# Patient Record
Sex: Female | Born: 1950 | Race: White | Hispanic: No | Marital: Married | Smoking: Never smoker
Health system: Southern US, Community
[De-identification: ages and names within clinical notes are randomized; demographics above are authoritative.]

## PROBLEM LIST (undated history)

## (undated) DIAGNOSIS — B059 Measles without complication: Secondary | ICD-10-CM

## (undated) DIAGNOSIS — B019 Varicella without complication: Secondary | ICD-10-CM

## (undated) DIAGNOSIS — T7840XA Allergy, unspecified, initial encounter: Secondary | ICD-10-CM

## (undated) HISTORY — DX: Varicella without complication: B01.9

## (undated) HISTORY — DX: Measles without complication: B05.9

## (undated) HISTORY — DX: Allergy, unspecified, initial encounter: T78.40XA

---

## 1987-07-31 HISTORY — PX: ABDOMINAL HYSTERECTOMY: SHX81

## 2012-06-29 LAB — HM MAMMOGRAPHY: HM Mammogram: NORMAL

## 2013-09-17 ENCOUNTER — Telehealth: Payer: Self-pay

## 2013-09-17 NOTE — Telephone Encounter (Signed)
Pt has new pt to establish appt on 09/21/13 and has not received NPP; pt will come early to fill out new pt packet prior to visit.

## 2013-09-21 ENCOUNTER — Ambulatory Visit (INDEPENDENT_AMBULATORY_CARE_PROVIDER_SITE_OTHER): Payer: BC Managed Care – PPO | Admitting: Internal Medicine

## 2013-09-21 ENCOUNTER — Encounter: Payer: Self-pay | Admitting: Internal Medicine

## 2013-09-21 VITALS — BP 146/98 | HR 99 | Temp 98.4°F | Ht 62.0 in | Wt 174.0 lb

## 2013-09-21 DIAGNOSIS — R059 Cough, unspecified: Secondary | ICD-10-CM

## 2013-09-21 DIAGNOSIS — J302 Other seasonal allergic rhinitis: Secondary | ICD-10-CM | POA: Insufficient documentation

## 2013-09-21 DIAGNOSIS — R05 Cough: Secondary | ICD-10-CM

## 2013-09-21 DIAGNOSIS — N951 Menopausal and female climacteric states: Secondary | ICD-10-CM

## 2013-09-21 DIAGNOSIS — R232 Flushing: Secondary | ICD-10-CM

## 2013-09-21 DIAGNOSIS — Z0000 Encounter for general adult medical examination without abnormal findings: Secondary | ICD-10-CM

## 2013-09-21 DIAGNOSIS — J309 Allergic rhinitis, unspecified: Secondary | ICD-10-CM

## 2013-09-21 DIAGNOSIS — E669 Obesity, unspecified: Secondary | ICD-10-CM | POA: Insufficient documentation

## 2013-09-21 LAB — CBC
HCT: 46.1 % — ABNORMAL HIGH (ref 36.0–46.0)
Hemoglobin: 15 g/dL (ref 12.0–15.0)
MCHC: 32.6 g/dL (ref 30.0–36.0)
MCV: 91.8 fl (ref 78.0–100.0)
Platelets: 365 10*3/uL (ref 150.0–400.0)
RBC: 5.02 Mil/uL (ref 3.87–5.11)
RDW: 13.7 % (ref 11.5–14.6)
WBC: 9 10*3/uL (ref 4.5–10.5)

## 2013-09-21 LAB — COMPREHENSIVE METABOLIC PANEL
ALT: 39 U/L — ABNORMAL HIGH (ref 0–35)
AST: 24 U/L (ref 0–37)
Albumin: 4.6 g/dL (ref 3.5–5.2)
Alkaline Phosphatase: 87 U/L (ref 39–117)
BUN: 13 mg/dL (ref 6–23)
CO2: 25 mEq/L (ref 19–32)
Calcium: 10.3 mg/dL (ref 8.4–10.5)
Chloride: 101 mEq/L (ref 96–112)
Creatinine, Ser: 0.7 mg/dL (ref 0.4–1.2)
GFR: 96.02 mL/min (ref >60.00)
Glucose, Bld: 104 mg/dL — ABNORMAL HIGH (ref 70–99)
Potassium: 4.1 mEq/L (ref 3.5–5.1)
Sodium: 138 mEq/L (ref 135–145)
Total Bilirubin: 0.6 mg/dL (ref 0.3–1.2)
Total Protein: 8.6 g/dL — ABNORMAL HIGH (ref 6.0–8.3)

## 2013-09-21 LAB — LDL CHOLESTEROL, DIRECT: Direct LDL: 168 mg/dL

## 2013-09-21 LAB — LIPID PANEL
Cholesterol: 247 mg/dL — ABNORMAL HIGH (ref 0–200)
HDL: 58 mg/dL (ref >39.00)
Total CHOL/HDL Ratio: 4
Triglycerides: 217 mg/dL — ABNORMAL HIGH (ref 0.0–149.0)
VLDL: 43.4 mg/dL — ABNORMAL HIGH (ref 0.0–40.0)

## 2013-09-21 LAB — TSH: TSH: 4.33 u[IU]/mL (ref 0.35–5.50)

## 2013-09-21 MED ORDER — ESTROGENS CONJUGATED 0.3 MG PO TABS: 0.3000 mg | ORAL_TABLET | Freq: Every day | ORAL | Status: DC

## 2013-09-21 MED ORDER — LEVOCETIRIZINE DIHYDROCHLORIDE 5 MG PO TABS: 5.0000 mg | ORAL_TABLET | Freq: Every evening | ORAL | Status: DC

## 2013-09-21 MED ORDER — BENZONATATE 200 MG PO CAPS: 200.0000 mg | ORAL_CAPSULE | Freq: Three times a day (TID) | ORAL | Status: DC | PRN

## 2013-09-21 NOTE — Progress Notes (Signed)
Pre visit review using our clinic review tool, if applicable. No additional management support is needed unless otherwise documented below in the visit note. 

## 2013-09-21 NOTE — Patient Instructions (Addendum)

## 2013-09-21 NOTE — Progress Notes (Signed)
HPI Pt presents to the clinic today to establish care. She is transferring care from her PCP in [place]. She recently moved down her in September. She does have some concerns about a cough she has had since moving down here. The cough is fairly unproductive. She denies fever, chills or body aches. She has tried Careers adviser, robitussin and mucinex without much relief. She does not smoke. She was seen at [facility] for the same. She was given a zpack which also provided no relief.  Flu: never Tetanus: not up to date Pneumovax: never Zostovax: never Pap Smear: 1989 Mammogram: 06/2012 Colon Screening: never, not ready to have one set up yet Bone Density: 2009 Eye doctor: as needed Dentist: as needed  Past Medical History  Diagnosis Date  . Chicken pox   . Measles   . Allergy     Current Outpatient Prescriptions  Medication Sig Dispense Refill  . B Complex-C (SUPER B COMPLEX/VITAMIN C PO) Take 1 capsule by mouth daily.      Marland Kitchen Bioflavonoid Products (ESTER-C) TABS Take 1 tablet by mouth daily.      . Calcium Carb-Cholecalciferol (CALCIUM + D3 PO) Take 1 capsule by mouth daily.      Marland Kitchen estrogens, conjugated, (PREMARIN) 0.3 MG tablet Take 0.3 mg by mouth daily. Take daily for 21 days then do not take for 7 days.      . fexofenadine (ALLEGRA) 180 MG tablet Take 180 mg by mouth daily.      . Fish Oil-Cholecalciferol (FISH OIL + D3) 1000-1000 MG-UNIT CAPS Take 2 capsules by mouth daily.      . Misc Natural Products (OSTEO BI-FLEX JOINT SHIELD) TABS Take 1 tablet by mouth daily.       No current facility-administered medications for this visit.    Allergies  Allergen Reactions  . Codeine Hives  . Dilaudid [Hydromorphone Hcl] Hives  . Keflex [Cephalexin] Hives  . Morphine And Related Hives  . Penicillins Hives    Family History  Problem Relation Age of Onset  . Arthritis Mother   . Breast cancer Sister   . Uterine cancer Sister   . Diabetes Daughter     History   Social History  .  Marital Status: Married    Spouse Name: N/A    Number of Children: N/A  . Years of Education: N/A   Occupational History  . Not on file.   Social History Main Topics  . Smoking status: Never Smoker   . Smokeless tobacco: Never Used  . Alcohol Use: 0.6 oz/week    1 Cans of beer per week     Comment: occasional  . Drug Use: No  . Sexual Activity: Not Currently   Other Topics Concern  . Not on file   Social History Narrative  . No narrative on file    ROS:  Constitutional: Denies fever, malaise, fatigue, headache or abrupt weight changes.  HEENT: Denies eye pain, eye redness, ear pain, ringing in the ears, wax buildup, runny nose, nasal congestion, bloody nose, or sore throat. Respiratory: Pt reports cough. Denies difficulty breathing, shortness of breath, cough or sputum production.   Cardiovascular: Denies chest pain, chest tightness, palpitations or swelling in the hands or feet.  Gastrointestinal: Denies abdominal pain, bloating, constipation, diarrhea or blood in the stool.  GU: Pt reports stress incontinence. Denies frequency, urgency, pain with urination, blood in urine, odor or discharge. Musculoskeletal: Denies decrease in range of motion, difficulty with gait, muscle pain or joint pain and  swelling.  Skin: Denies redness, rashes, lesions or ulcercations.  Neurological: Denies dizziness, difficulty with memory, difficulty with speech or problems with balance and coordination.   No other specific complaints in a complete review of systems (except as listed in HPI above).  PE:  BP 146/98  Pulse 99  Temp(Src) 98.4 F (36.9 C) (Oral)  Ht 5\' 2"  (1.575 m)  Wt 174 lb (78.926 kg)  BMI 31.82 kg/m2  SpO2 98% Wt Readings from Last 3 Encounters:  09/21/13 174 lb (78.926 kg)    General: Appears her stated age, obese but well developed, well nourished in NAD. HEENT: Head: normal shape and size; Eyes: sclera white, no icterus, conjunctiva pink, PERRLA and EOMs intact;  Ears: Tm's gray and intact, normal light reflex; Nose: mucosa pink and moist, septum midline; Throat/Mouth: Teeth present, mucosa pink and moist, no lesions or ulcerations noted.  Neck: Normal range of motion. Neck supple, trachea midline. No massses, lumps or thyromegaly present.  Cardiovascular: Normal rate and rhythm. S1,S2 noted.  No murmur, rubs or gallops noted. No JVD or BLE edema. No carotid bruits noted. Pulmonary/Chest: Normal effort and positive vesicular breath sounds. No respiratory distress. No wheezes, rales or ronchi noted.  Abdomen: Soft and nontender. Normal bowel sounds, no bruits noted. No distention or masses noted. Liver, spleen and kidneys non palpable. Musculoskeletal: Normal range of motion. No signs of joint swelling. No difficulty with gait.  Neurological: Alert and oriented. Cranial nerves II-XII intact. Coordination normal. +DTRs bilaterally. Psychiatric: Mood and affect normal. Behavior is normal. Judgment and thought content normal.      Assessment and Plan:  Preventative Health Maintenance:  Encouraged her to work on diet and exercise She declines all preventative vaccines Encouraged her to establish with an eye doctor and dentist  Will obtain screening lab work today She declines colon screening at this time She declines repeat bone scan   Cough:  ? Allergy related She declines RX for flonase Will switch allegra to xyzal 5 mg daily eRx for Tessalon pearls  RTC in 1 year or sooner if cough does not resolve

## 2013-10-18 ENCOUNTER — Other Ambulatory Visit: Payer: Self-pay | Admitting: Internal Medicine

## 2013-10-19 NOTE — Telephone Encounter (Signed)
Received refill request from pharmacy electronically. Prescriptions were given at office visit on  09/21/13 with no refills. Is it okay to refill medication?

## 2014-01-12 ENCOUNTER — Other Ambulatory Visit: Payer: Self-pay | Admitting: *Deleted

## 2014-01-12 MED ORDER — ESTROGENS CONJUGATED 0.3 MG PO TABS: ORAL_TABLET | ORAL | Status: DC

## 2014-01-12 MED ORDER — LEVOCETIRIZINE DIHYDROCHLORIDE 5 MG PO TABS: ORAL_TABLET | ORAL | Status: DC

## 2014-03-01 ENCOUNTER — Ambulatory Visit (INDEPENDENT_AMBULATORY_CARE_PROVIDER_SITE_OTHER): Payer: BC Managed Care – PPO | Admitting: Internal Medicine

## 2014-03-01 ENCOUNTER — Encounter: Payer: Self-pay | Admitting: Internal Medicine

## 2014-03-01 VITALS — BP 148/80 | HR 97 | Temp 98.6°F | Wt 181.0 lb

## 2014-03-01 DIAGNOSIS — B309 Viral conjunctivitis, unspecified: Secondary | ICD-10-CM

## 2014-03-01 NOTE — Progress Notes (Signed)
Pre visit review using our clinic review tool, if applicable. No additional management support is needed unless otherwise documented below in the visit note. 

## 2014-03-01 NOTE — Patient Instructions (Addendum)

## 2014-03-01 NOTE — Progress Notes (Signed)
Subjective:    Patient ID: Kristina Bradley, female    DOB: 07-22-1951, 63 y.o.   MRN: [id number]  HPI  Pt presents to the clinic today with c/o  redness in the eye. She reports it started yesterday. There is no pain or drainage from the eye. She has not tried anything OTC. She has not come in contact with anyone that she know has pink eye. She has not tried on any new makeup products.  Review of Systems      Past Medical History  Diagnosis Date  . Chicken pox   . Measles   . Allergy     Current Outpatient Prescriptions  Medication Sig Dispense Refill  . B Complex-C (SUPER B COMPLEX/VITAMIN C PO) Take 1 capsule by mouth daily.      . benzonatate (TESSALON) 200 MG capsule Take 1 capsule (200 mg total) by mouth 3 (three) times daily as needed for cough.  30 capsule  0  . Bioflavonoid Products (ESTER-C) TABS Take 1 tablet by mouth daily.      . Calcium Carb-Cholecalciferol (CALCIUM + D3 PO) Take 1 capsule by mouth daily.      Marland Kitchen estrogens, conjugated, (PREMARIN) 0.3 MG tablet TAKE 1 TABLET (0.3 MG TOTAL) BY MOUTH DAILY. TAKE DAILY FOR 21 DAYS THEN DO NOT TAKE FOR 7 DAYS.  30 tablet  2  . fexofenadine (ALLEGRA) 180 MG tablet Take 180 mg by mouth daily.      . Fish Oil-Cholecalciferol (FISH OIL + D3) 1000-1000 MG-UNIT CAPS Take 2 capsules by mouth daily.      Marland Kitchen levocetirizine (XYZAL) 5 MG tablet TAKE 1 TABLET (5 MG TOTAL) BY MOUTH EVERY EVENING.  30 tablet  5  . Misc Natural Products (OSTEO BI-FLEX JOINT SHIELD) TABS Take 1 tablet by mouth daily.       No current facility-administered medications for this visit.    Allergies  Allergen Reactions  . Codeine Hives  . Dilaudid [Hydromorphone Hcl] Hives  . Keflex [Cephalexin] Hives  . Morphine And Related Hives  . Penicillins Hives    Family History  Problem Relation Age of Onset  . Arthritis Mother   . Breast cancer Sister   . Uterine cancer Sister   . Diabetes Daughter     History   Social History  . Marital Status:  Married    Spouse Name: N/A    Number of Children: N/A  . Years of Education: N/A   Occupational History  . Not on file.   Social History Main Topics  . Smoking status: Never Smoker   . Smokeless tobacco: Never Used  . Alcohol Use: 0.6 oz/week    1 Cans of beer per week     Comment: occasional  . Drug Use: No  . Sexual Activity: Not Currently   Other Topics Concern  . Not on file   Social History Narrative  . No narrative on file     Constitutional: Denies fever, malaise, fatigue, headache or abrupt weight changes.  HEENT: Pt reports eye pain and redness. Denies ear pain, ringing in the ears, wax buildup, runny nose, nasal congestion, bloody nose, or sore throat.  No other specific complaints in a complete review of systems (except as listed in HPI above).  Objective:   Physical Exam  BP 148/80  Pulse 97  Temp(Src) 98.6 F (37 C) (Tympanic)  Wt 181 lb (82.101 kg)  SpO2 100% Wt Readings from Last 3 Encounters:  03/01/14 181 lb (82.101 kg)  09/21/13 174 lb (78.926 kg)    General: Appears her stated age, well developed, well nourished in NAD. HEENT: Head: normal shape and size; Eyes: sclera injected, no icterus, conjunctiva pink, no discharge noted.  BMET    Component Value Date/Time   NA 138 09/21/2013 1132   K 4.1 09/21/2013 1132   CL 101 09/21/2013 1132   CO2 25 09/21/2013 1132   GLUCOSE 104* 09/21/2013 1132   BUN 13 09/21/2013 1132   CREATININE 0.7 09/21/2013 1132   CALCIUM 10.3 09/21/2013 1132    Lipid Panel     Component Value Date/Time   CHOL 247* 09/21/2013 1132   TRIG 217.0* 09/21/2013 1132   HDL 58.00 09/21/2013 1132   CHOLHDL 4 09/21/2013 1132   VLDL 43.4* 09/21/2013 1132    CBC    Component Value Date/Time   WBC 9.0 09/21/2013 1132   RBC 5.02 09/21/2013 1132   HGB 15.0 09/21/2013 1132   HCT 46.1* 09/21/2013 1132   PLT 365.0 09/21/2013 1132   MCV 91.8 09/21/2013 1132   MCHC 32.6 09/21/2013 1132   RDW 13.7 09/21/2013 1132    Hgb A1C No results  found for this basename: HGBA1C         Assessment & Plan:   Viral conjunctivitis of left eye:  Warm compresses TID Saline eye drops as needed for comfort Try to avoid touching your eye Ibuprofen for pain/inflammation  RTC as needed

## 2014-05-17 ENCOUNTER — Other Ambulatory Visit: Payer: Self-pay | Admitting: Internal Medicine

## 2014-05-18 ENCOUNTER — Other Ambulatory Visit: Payer: Self-pay | Admitting: Internal Medicine

## 2014-07-13 ENCOUNTER — Encounter: Payer: Self-pay | Admitting: Internal Medicine

## 2014-07-13 ENCOUNTER — Ambulatory Visit (INDEPENDENT_AMBULATORY_CARE_PROVIDER_SITE_OTHER): Payer: BC Managed Care – PPO | Admitting: Internal Medicine

## 2014-07-13 VITALS — BP 140/74 | HR 103 | Temp 98.3°F | Wt 179.0 lb

## 2014-07-13 DIAGNOSIS — R05 Cough: Secondary | ICD-10-CM

## 2014-07-13 DIAGNOSIS — J0100 Acute maxillary sinusitis, unspecified: Secondary | ICD-10-CM

## 2014-07-13 DIAGNOSIS — R059 Cough, unspecified: Secondary | ICD-10-CM

## 2014-07-13 MED ORDER — ESTROGENS CONJUGATED 0.3 MG PO TABS: ORAL_TABLET | ORAL | Status: DC

## 2014-07-13 MED ORDER — BENZONATATE 200 MG PO CAPS: 200.0000 mg | ORAL_CAPSULE | Freq: Three times a day (TID) | ORAL | Status: DC | PRN

## 2014-07-13 MED ORDER — DOXYCYCLINE HYCLATE 100 MG PO TABS: 100.0000 mg | ORAL_TABLET | Freq: Two times a day (BID) | ORAL | Status: DC

## 2014-07-13 NOTE — Progress Notes (Signed)
HPI  Pt presents to the clinic today with c/o headache, ear fullness, facial pain and pressure and cough . She reports this started 1 week ago. The cough is productive of yellow mucous. She is blowing yellow mucous out of her nose. She denies fever, but has had chills and body aches. She has tried Xyzal without much relief. She does have a history of seasonal allergies. She has not had sick contacts that she is aware of. She does not smoke.  Review of Systems    Past Medical History  Diagnosis Date  . Chicken pox   . Measles   . Allergy     Family History  Problem Relation Age of Onset  . Arthritis Mother   . Breast cancer Sister   . Uterine cancer Sister   . Diabetes Daughter     History   Social History  . Marital Status: Married    Spouse Name: N/A    Number of Children: N/A  . Years of Education: N/A   Occupational History  . Not on file.   Social History Main Topics  . Smoking status: Never Smoker   . Smokeless tobacco: Never Used  . Alcohol Use: 0.6 oz/week    1 Cans of beer per week     Comment: occasional  . Drug Use: No  . Sexual Activity: Not Currently   Other Topics Concern  . Not on file   Social History Narrative    Allergies  Allergen Reactions  . Codeine Hives  . Dilaudid [Hydromorphone Hcl] Hives  . Keflex [Cephalexin] Hives  . Morphine And Related Hives  . Penicillins Hives     Constitutional: Positive headache, fatigueDenies fever or abrupt weight changes.  HEENT:  Positive facial pain, ear pain, nasal congestion and sore throat. Denies eye redness, ringing in the ears, wax buildup, runny nose or bloody nose. Respiratory: Positive cough. Denies difficulty breathing or shortness of breath.  Cardiovascular: Denies chest pain, chest tightness, palpitations or swelling in the hands or feet.   No other specific complaints in a complete review of systems (except as listed in HPI above).  Objective:  BP 140/74 mmHg  Pulse 103  Temp(Src)  98.3 F (36.8 C) (Oral)  Wt 179 lb (81.194 kg)  SpO2 98%   General: Appearsherstated age in NAD. HEENT: Head: normal shape and size, maxillary sinus tenderness noted;  Ears: Tm's pink but intact, normal light reflex, + serous effusion on the left; Nose: mucosa pink and moist, septum midline; Throat/Mouth: + PND. Teeth present, mucosa pink and moist, no exudate noted, no lesions or ulcerations noted.  Neck: No adenopathy noted. Cardiovascular: Normal rate and rhythm. S1,S2 noted.  No murmur, rubs or gallops noted.  Pulmonary/Chest: Normal effort and positive vesicular breath sounds. No respiratory distress. No wheezes, rales or ronchi noted.      Assessment & Plan:   Acute maxillarysinusitis  Can use a Neti Pot which can be purchased from your local drug store. Continue xyzal She reports she cannot take steroid nasal sprays, they make her worse Doxycycline BID for 10 days eRx for Tessalon pearls  RTC as needed or if symptoms persist.

## 2014-07-13 NOTE — Patient Instructions (Signed)

## 2014-07-13 NOTE — Progress Notes (Signed)
Pre visit review using our clinic review tool, if applicable. No additional management support is needed unless otherwise documented below in the visit note. 

## 2014-07-15 ENCOUNTER — Other Ambulatory Visit: Payer: Self-pay | Admitting: Internal Medicine

## 2014-07-20 ENCOUNTER — Encounter: Payer: Self-pay | Admitting: Internal Medicine

## 2014-07-21 ENCOUNTER — Other Ambulatory Visit: Payer: Self-pay | Admitting: Internal Medicine

## 2014-07-21 MED ORDER — LEVOCETIRIZINE DIHYDROCHLORIDE 5 MG PO TABS: ORAL_TABLET | ORAL | Status: DC

## 2014-09-14 ENCOUNTER — Other Ambulatory Visit: Payer: Self-pay | Admitting: Internal Medicine

## 2014-09-14 NOTE — Telephone Encounter (Signed)
Is pt supposed to take this medication--please advise as i saw it was d/c

## 2014-09-15 NOTE — Telephone Encounter (Signed)
Yes should be taking, It says d/c then next to it says, reason for d/c: reorder. Will send electronically

## 2015-01-02 ENCOUNTER — Other Ambulatory Visit: Payer: Self-pay | Admitting: Internal Medicine

## 2015-01-05 ENCOUNTER — Other Ambulatory Visit: Payer: Self-pay | Admitting: Internal Medicine

## 2015-02-08 ENCOUNTER — Other Ambulatory Visit: Payer: Self-pay | Admitting: Internal Medicine

## 2015-03-07 ENCOUNTER — Other Ambulatory Visit: Payer: Self-pay | Admitting: Internal Medicine

## 2015-03-10 ENCOUNTER — Other Ambulatory Visit: Payer: Self-pay | Admitting: Internal Medicine

## 2015-04-06 ENCOUNTER — Other Ambulatory Visit: Payer: Self-pay | Admitting: Internal Medicine

## 2015-05-08 ENCOUNTER — Other Ambulatory Visit: Payer: Self-pay | Admitting: Internal Medicine

## 2015-05-09 ENCOUNTER — Encounter: Payer: Self-pay | Admitting: Internal Medicine

## 2015-05-09 ENCOUNTER — Ambulatory Visit (INDEPENDENT_AMBULATORY_CARE_PROVIDER_SITE_OTHER): Payer: Medicare Other | Admitting: Internal Medicine

## 2015-05-09 VITALS — BP 140/92 | HR 76 | Temp 98.0°F | Ht 61.5 in | Wt 177.0 lb

## 2015-05-09 DIAGNOSIS — J309 Allergic rhinitis, unspecified: Secondary | ICD-10-CM

## 2015-05-09 DIAGNOSIS — N959 Unspecified menopausal and perimenopausal disorder: Secondary | ICD-10-CM | POA: Diagnosis not present

## 2015-05-09 MED ORDER — ESTROGENS CONJUGATED 0.3 MG PO TABS: ORAL_TABLET | ORAL | Status: DC

## 2015-05-09 MED ORDER — LEVOCETIRIZINE DIHYDROCHLORIDE 5 MG PO TABS: ORAL_TABLET | ORAL | Status: DC

## 2015-05-09 NOTE — Patient Instructions (Signed)
Allergic Rhinitis Allergic rhinitis is when the mucous membranes in the nose respond to allergens. Allergens are particles in the air that cause your body to have an allergic reaction. This causes you to release allergic antibodies. Through a chain of events, these eventually cause you to release histamine into the blood stream. Although meant to protect the body, it is this release of histamine that causes your discomfort, such as frequent sneezing, congestion, and an itchy, runny nose.  CAUSES Seasonal allergic rhinitis (hay fever) is caused by pollen allergens that may come from grasses, trees, and weeds. Year-round allergic rhinitis (perennial allergic rhinitis) is caused by allergens such as house dust mites, pet dander, and mold spores. SYMPTOMS  Nasal stuffiness (congestion).  Itchy, runny nose with sneezing and tearing of the eyes. DIAGNOSIS Your health care provider can help you determine the allergen or allergens that trigger your symptoms. If you and your health care provider are unable to determine the allergen, skin or blood testing may be used. Your health care provider will diagnose your condition after taking your health history and performing a physical exam. Your health care provider may assess you for other related conditions, such as asthma, pink eye, or an ear infection. TREATMENT Allergic rhinitis does not have a cure, but it can be controlled by:  Medicines that block allergy symptoms. These may include allergy shots, nasal sprays, and oral antihistamines.  Avoiding the allergen. Hay fever may often be treated with antihistamines in pill or nasal spray forms. Antihistamines block the effects of histamine. There are over-the-counter medicines that may help with nasal congestion and swelling around the eyes. Check with your health care provider before taking or giving this medicine. If avoiding the allergen or the medicine prescribed do not work, there are many new medicines  your health care provider can prescribe. Stronger medicine may be used if initial measures are ineffective. Desensitizing injections can be used if medicine and avoidance does not work. Desensitization is when a patient is given ongoing shots until the body becomes less sensitive to the allergen. Make sure you follow up with your health care provider if problems continue. HOME CARE INSTRUCTIONS It is not possible to completely avoid allergens, but you can reduce your symptoms by taking steps to limit your exposure to them. It helps to know exactly what you are allergic to so that you can avoid your specific triggers. SEEK MEDICAL CARE IF:  You have a fever.  You develop a cough that does not stop easily (persistent).  You have shortness of breath.  You start wheezing.  Symptoms interfere with normal daily activities.   This information is not intended to replace advice given to you by your health care provider. Make sure you discuss any questions you have with your health care provider.   Document Released: 04/10/2001 Document Revised: 08/06/2014 Document Reviewed: 03/23/2013 Elsevier Interactive Patient Education 2016 [employer]  

## 2015-05-09 NOTE — Progress Notes (Signed)
Pre visit review using our clinic review tool, if applicable. No additional management support is needed unless otherwise documented below in the visit note. 

## 2015-05-09 NOTE — Progress Notes (Signed)
Subjective:    Patient ID: Kristina Bradley, female    DOB: [birth year], 64 y.o.   MRN: [id number]  HPI  Pt presents to the clinic today to follow up chronic conditions and for medication refills.   Allergies: Occur all year long. Worse since she moved to this area. She takes Xyzal daily which does provide some relief.  Postmenopausal symptoms: Controlled on Premarin. She did try to come off this about 2 years ago and her symptoms returned. She can not tolerate the hot flashes off the Premarin. She does not smoke.    Review of Systems      Past Medical History  Diagnosis Date  . Chicken pox   . Measles   . Allergy     Current Outpatient Prescriptions  Medication Sig Dispense Refill  . B Complex-C (SUPER B COMPLEX/VITAMIN C PO) Take 1 capsule by mouth daily.    Marland Kitchen Bioflavonoid Products (ESTER-C) TABS Take 1 tablet by mouth daily.    . Calcium Carb-Cholecalciferol (CALCIUM + D3 PO) Take 1 capsule by mouth daily.    . Calcium Polycarbophil (FIBER-CAPS PO) Take 1 capsule by mouth daily.    Marland Kitchen estrogens, conjugated, (PREMARIN) 0.3 MG tablet TAKE 1 TABLET BY MOUTH DAILY FOR 21 DAYS, THEN DO NOT TAKE FOR 7 DAYS. 30 tablet 11  . Fish Oil-Cholecalciferol (FISH OIL + D3) 1000-1000 MG-UNIT CAPS Take 2 capsules by mouth daily.    Marland Kitchen levocetirizine (XYZAL) 5 MG tablet TAKE 1 TABLET (5 MG TOTAL) BY MOUTH EVERY EVENING. 30 tablet 11  . Misc Natural Products (OSTEO BI-FLEX JOINT SHIELD) TABS Take 1 tablet by mouth daily.     No current facility-administered medications for this visit.    Allergies  Allergen Reactions  . Codeine Hives  . Dilaudid [Hydromorphone Hcl] Hives  . Keflex [Cephalexin] Hives  . Morphine And Related Hives  . Penicillins Hives    Family History  Problem Relation Age of Onset  . Arthritis Mother   . Breast cancer Sister   . Uterine cancer Sister   . Diabetes Daughter     Social History   Social History  . Marital Status: Married    Spouse Name: N/A  .  Number of Children: N/A  . Years of Education: N/A   Occupational History  . Not on file.   Social History Main Topics  . Smoking status: Never Smoker   . Smokeless tobacco: Never Used  . Alcohol Use: 0.6 oz/week    1 Cans of beer per week     Comment: occasional  . Drug Use: No  . Sexual Activity: Not Currently   Other Topics Concern  . Not on file   Social History Narrative     Constitutional: Denies fever, malaise, fatigue, headache or abrupt weight changes.  HEENT: Denies eye pain, eye redness, ear pain, ringing in the ears, wax buildup, runny nose, nasal congestion, bloody nose, or sore throat. Respiratory: Denies difficulty breathing, shortness of breath, cough or sputum production.   Cardiovascular: Denies chest pain, chest tightness, palpitations or swelling in the hands or feet.  Neurological: Denies dizziness, difficulty with memory, difficulty with speech or problems with balance and coordination.  Psych: Denies anxiety, depression, SI/HI.  No other specific complaints in a complete review of systems (except as listed in HPI above).  Objective:   Physical Exam  BP 140/92 mmHg  Pulse 76  Temp(Src) 98 F (36.7 C) (Oral)  Ht 5' 1.5" (1.562 m)  Wt 177 lb (80.287  kg)  BMI 32.91 kg/m2  SpO2 98% Wt Readings from Last 3 Encounters:  05/09/15 177 lb (80.287 kg)  07/13/14 179 lb (81.194 kg)  03/01/14 181 lb (82.101 kg)    General: Appears her stated age, obese in NAD. HEENT: Head: normal shape and size; Eyes: sclera white, no icterus, conjunctiva pink, PERRLA and EOMs intact; Ears: Tm's gray and intact, normal light reflex; Nose: mucosa pink and moist, septum midline; Throat/Mouth: Teeth present, mucosa pink and moist, no exudate, lesions or ulcerations noted.  Cardiovascular: Normal rate and rhythm. S1,S2 noted.   Pulmonary/Chest: Normal effort and positive vesicular breath sounds. No respiratory distress. No wheezes, rales or ronchi noted.  Neurological: Alert  and oriented.    BMET    Component Value Date/Time   NA 138 09/21/2013 1132   K 4.1 09/21/2013 1132   CL 101 09/21/2013 1132   CO2 25 09/21/2013 1132   GLUCOSE 104* 09/21/2013 1132   BUN 13 09/21/2013 1132   CREATININE 0.7 09/21/2013 1132   CALCIUM 10.3 09/21/2013 1132    Lipid Panel     Component Value Date/Time   CHOL 247* 09/21/2013 1132   TRIG 217.0* 09/21/2013 1132   HDL 58.00 09/21/2013 1132   CHOLHDL 4 09/21/2013 1132   VLDL 43.4* 09/21/2013 1132    CBC    Component Value Date/Time   WBC 9.0 09/21/2013 1132   RBC 5.02 09/21/2013 1132   HGB 15.0 09/21/2013 1132   HCT 46.1* 09/21/2013 1132   PLT 365.0 09/21/2013 1132   MCV 91.8 09/21/2013 1132   MCHC 32.6 09/21/2013 1132   RDW 13.7 09/21/2013 1132    Hgb A1C No results found for: HGBA1C       Assessment & Plan:  Allergies:  Controlled on Zyrtec Medication refilled per request  Postmenopausal symptoms:  Controlled on Premarin Medication refilled per requets  Make an appt for your Welcome to Medicare appt

## 2015-05-11 ENCOUNTER — Other Ambulatory Visit: Payer: Self-pay | Admitting: Internal Medicine

## 2015-05-11 MED ORDER — LEVOCETIRIZINE DIHYDROCHLORIDE 5 MG PO TABS: ORAL_TABLET | ORAL | Status: DC

## 2015-05-11 NOTE — Telephone Encounter (Signed)
Pt left note Xyzal is not at [facility] [address]. At 05/09/15 visit R Baity NP refilled Xyzal # 30 x 11 but was sent as OTC instead of normal. Resending now. Pt notified done.

## 2015-05-23 ENCOUNTER — Ambulatory Visit (INDEPENDENT_AMBULATORY_CARE_PROVIDER_SITE_OTHER): Payer: Medicare Other | Admitting: Internal Medicine

## 2015-05-23 ENCOUNTER — Encounter: Payer: Self-pay | Admitting: Internal Medicine

## 2015-05-23 VITALS — BP 148/96 | HR 97 | Temp 98.2°F | Ht 61.5 in | Wt 177.0 lb

## 2015-05-23 DIAGNOSIS — E785 Hyperlipidemia, unspecified: Secondary | ICD-10-CM | POA: Diagnosis not present

## 2015-05-23 DIAGNOSIS — Z13820 Encounter for screening for osteoporosis: Secondary | ICD-10-CM

## 2015-05-23 DIAGNOSIS — Z1239 Encounter for other screening for malignant neoplasm of breast: Secondary | ICD-10-CM | POA: Diagnosis not present

## 2015-05-23 DIAGNOSIS — Z1211 Encounter for screening for malignant neoplasm of colon: Secondary | ICD-10-CM | POA: Diagnosis not present

## 2015-05-23 DIAGNOSIS — Z0000 Encounter for general adult medical examination without abnormal findings: Secondary | ICD-10-CM

## 2015-05-23 LAB — COMPREHENSIVE METABOLIC PANEL
ALT: 41 U/L — ABNORMAL HIGH (ref 0–35)
AST: 27 U/L (ref 0–37)
Albumin: 4.2 g/dL (ref 3.5–5.2)
Alkaline Phosphatase: 94 U/L (ref 39–117)
BUN: 12 mg/dL (ref 6–23)
CO2: 26 mEq/L (ref 19–32)
Calcium: 9.9 mg/dL (ref 8.4–10.5)
Chloride: 102 mEq/L (ref 96–112)
Creatinine, Ser: 0.81 mg/dL (ref 0.40–1.20)
GFR: 75.41 mL/min (ref >60.00)
Glucose, Bld: 166 mg/dL — ABNORMAL HIGH (ref 70–99)
Potassium: 3.7 mEq/L (ref 3.5–5.1)
Sodium: 139 mEq/L (ref 135–145)
Total Bilirubin: 0.3 mg/dL (ref 0.2–1.2)
Total Protein: 7.6 g/dL (ref 6.0–8.3)

## 2015-05-23 LAB — CBC
HCT: 44.7 % (ref 36.0–46.0)
Hemoglobin: 14.8 g/dL (ref 12.0–15.0)
MCHC: 33.2 g/dL (ref 30.0–36.0)
MCV: 90.5 fl (ref 78.0–100.0)
Platelets: 321 10*3/uL (ref 150.0–400.0)
RBC: 4.94 Mil/uL (ref 3.87–5.11)
RDW: 13.1 % (ref 11.5–15.5)
WBC: 8.4 10*3/uL (ref 4.0–10.5)

## 2015-05-23 LAB — LIPID PANEL
Cholesterol: 224 mg/dL — ABNORMAL HIGH (ref 0–200)
HDL: 48.9 mg/dL (ref >39.00)
NonHDL: 174.95
Total CHOL/HDL Ratio: 5
Triglycerides: 228 mg/dL — ABNORMAL HIGH (ref 0.0–149.0)
VLDL: 45.6 mg/dL — ABNORMAL HIGH (ref 0.0–40.0)

## 2015-05-23 LAB — VITAMIN D 25 HYDROXY (VIT D DEFICIENCY, FRACTURES): VITD: 55.68 ng/mL (ref 30.00–100.00)

## 2015-05-23 LAB — LDL CHOLESTEROL, DIRECT: Direct LDL: 160 mg/dL

## 2015-05-23 LAB — HEMOGLOBIN A1C: Hgb A1c MFr Bld: 6.5 % (ref 4.6–6.5)

## 2015-05-23 NOTE — Patient Instructions (Signed)

## 2015-05-23 NOTE — Addendum Note (Signed)
Addended by: Alvina ChouWALSH, TERRI J on: 05/23/2015 05:13 PM   Modules accepted: Orders

## 2015-05-23 NOTE — Progress Notes (Addendum)
HPI:  Pt presents to the clinic today for her Welcome to Medicare visit.  Past Medical History  Diagnosis Date  . Chicken pox   . Measles   . Allergy     Current Outpatient Prescriptions  Medication Sig Dispense Refill  . B Complex-C (SUPER B COMPLEX/VITAMIN C PO) Take 1 capsule by mouth daily.    Marland Kitchen Bioflavonoid Products (ESTER-C) TABS Take 1 tablet by mouth daily.    . Calcium Carb-Cholecalciferol (CALCIUM + D3 PO) Take 1 capsule by mouth daily.    . Calcium Polycarbophil (FIBER-CAPS PO) Take 1 capsule by mouth daily.    Marland Kitchen estrogens, conjugated, (PREMARIN) 0.3 MG tablet TAKE 1 TABLET BY MOUTH DAILY FOR 21 DAYS, THEN DO NOT TAKE FOR 7 DAYS. 30 tablet 11  . Fish Oil-Cholecalciferol (FISH OIL + D3) 1000-1000 MG-UNIT CAPS Take 2 capsules by mouth daily.    Marland Kitchen levocetirizine (XYZAL) 5 MG tablet TAKE 1 TABLET (5 MG TOTAL) BY MOUTH EVERY EVENING. 30 tablet 11  . Misc Natural Products (OSTEO BI-FLEX JOINT SHIELD) TABS Take 1 tablet by mouth daily.     No current facility-administered medications for this visit.    Allergies  Allergen Reactions  . Codeine Hives  . Dilaudid [Hydromorphone Hcl] Hives  . Keflex [Cephalexin] Hives  . Morphine And Related Hives  . Penicillins Hives    Family History  Problem Relation Age of Onset  . Arthritis Mother   . Breast cancer Sister   . Uterine cancer Sister   . Diabetes Daughter     Social History   Social History  . Marital Status: Married    Spouse Name: N/A  . Number of Children: N/A  . Years of Education: N/A   Occupational History  . Not on file.   Social History Main Topics  . Smoking status: Never Smoker   . Smokeless tobacco: Never Used  . Alcohol Use: 0.6 oz/week    1 Cans of beer per week     Comment: occasional  . Drug Use: No  . Sexual Activity: Not Currently   Other Topics Concern  . Not on file   Social History Narrative    Hospitiliaztions: None  Health Maintenance:    Flu: never  Tetanus: >10 years  ago  Pneumovax: never  Prevnar: never  Zostavax: never  Mammogram: 2013, normal by her report  Pap Smear: Hysterectomy -total  Bone Density: > 2 years ago, normal by her report  Colon Screening: never  Eye Doctor: yearly at [facility]  Dental Exam: biannually   Providers:   PCP: Nicki Reaper, NP-C  Opthalmologist: Scheduled at [facility].  I have personally reviewed and have noted:  1. The patient's medical and social history 2. Their use of alcohol, tobacco or illicit drugs 3. Their current medications and supplements 4. The patient's functional ability including ADL's, fall risks, home safety risks and  hearing or visual impairment. 5. Diet and physical activities 6. Evidence for depression or mood disorder  Subjective:   Review of Systems:   Constitutional: Denies fever, malaise, fatigue, headache or abrupt weight changes.  HEENT: Denies eye pain, eye redness, ear pain, ringing in the ears, wax buildup, runny nose, nasal congestion, bloody nose, or sore throat. Respiratory: Denies difficulty breathing, shortness of breath, cough or sputum production.   Cardiovascular: Denies chest pain, chest tightness, palpitations or swelling in the hands or feet.  Gastrointestinal: Denies abdominal pain, bloating, constipation, diarrhea or blood in the stool.  GU:  Denies urgency, frequency, pain with urination, burning sensation, blood in urine, odor or discharge. Musculoskeletal: Denies decrease in range of motion, difficulty with gait, muscle pain or joint pain and swelling.  Skin: Denies redness, rashes, lesions or ulcercations.  Neurological: Denies dizziness, difficulty with memory, difficulty with speech or problems with balance and coordination.  Psych: Denies anxiety, depression, SI/HI.  No other specific complaints in a complete review of systems (except as listed in HPI above).  Objective:  PE:   BP 148/96 mmHg  Pulse 97  Temp(Src) 98.2 F (36.8  C) (Oral)  Ht 5' 1.5" (1.562 m)  Wt 177 lb (80.287 kg)  BMI 32.91 kg/m2  SpO2 98% Wt Readings from Last 3 Encounters:  05/23/15 177 lb (80.287 kg)  05/09/15 177 lb (80.287 kg)  07/13/14 179 lb (81.194 kg)    General: Appears her stated age, obese in NAD.  Cardiovascular: Normal rate and rhythm. S1,S2 noted.  No murmur, rubs or gallops noted.  Pulmonary/Chest: Normal effort and positive vesicular breath sounds. No respiratory distress. No wheezes, rales or ronchi noted.  Neurological: Alert and oriented.  Psychiatric: Mood and affect normal. Behavior is normal. Judgment and thought content normal.   EKG: She declines today  BMET    Component Value Date/Time   NA 138 09/21/2013 1132   K 4.1 09/21/2013 1132   CL 101 09/21/2013 1132   CO2 25 09/21/2013 1132   GLUCOSE 104* 09/21/2013 1132   BUN 13 09/21/2013 1132   CREATININE 0.7 09/21/2013 1132   CALCIUM 10.3 09/21/2013 1132    Lipid Panel     Component Value Date/Time   CHOL 247* 09/21/2013 1132   TRIG 217.0* 09/21/2013 1132   HDL 58.00 09/21/2013 1132   CHOLHDL 4 09/21/2013 1132   VLDL 43.4* 09/21/2013 1132    CBC    Component Value Date/Time   WBC 9.0 09/21/2013 1132   RBC 5.02 09/21/2013 1132   HGB 15.0 09/21/2013 1132   HCT 46.1* 09/21/2013 1132   PLT 365.0 09/21/2013 1132   MCV 91.8 09/21/2013 1132   MCHC 32.6 09/21/2013 1132   RDW 13.7 09/21/2013 1132    Hgb A1C No results found for: HGBA1C    Assessment and Plan:   Medicare Initial Wellness Visit:  Diet: Low fat diet Physical activity: She is walking 1 miles- 2-3 x week Depression/mood screen: Negative Hearing: Intact to whispered voice Visual acuity: Grossly normal, performs annual eye exam  ADLs: Capable Fall risk: None Home safety: Good Cognitive evaluation: Intact to orientation, naming, recall and repetition EOL planning: Adv directives drawn up but not finalized, full code/ I agree  Preventative Medicine: She declines all  recommended vaccines. [facility] see Shirlee LimerickMarion on the way out to schedule. She declines colonoscopy but is agreeable to IFOB- test ordered, she will get in the lab. Will check CBC, CMET, Lipid, A1C and Vit D today. Encouraged her to continue to see an eye doctor and dentist at least annually.   Next appointment: 1 year, Medicare Wellness

## 2015-05-23 NOTE — Progress Notes (Signed)
Pre visit review using our clinic review tool, if applicable. No additional management support is needed unless otherwise documented below in the visit note. 

## 2015-05-26 DIAGNOSIS — H524 Presbyopia: Secondary | ICD-10-CM | POA: Diagnosis not present

## 2015-05-26 DIAGNOSIS — H04123 Dry eye syndrome of bilateral lacrimal glands: Secondary | ICD-10-CM | POA: Diagnosis not present

## 2015-05-26 DIAGNOSIS — H00029 Hordeolum internum unspecified eye, unspecified eyelid: Secondary | ICD-10-CM | POA: Diagnosis not present

## 2015-05-26 DIAGNOSIS — H40023 Open angle with borderline findings, high risk, bilateral: Secondary | ICD-10-CM | POA: Diagnosis not present

## 2015-06-04 NOTE — Addendum Note (Signed)
Addended by: Lorre MunroeBAITY, Mystie Ormand W on: 06/04/2015 10:24 PM   Modules accepted: Kipp BroodSmartSet

## 2015-06-06 ENCOUNTER — Ambulatory Visit
Admission: RE | Admit: 2015-06-06 | Discharge: 2015-06-06 | Disposition: A | Discharge status: Home / Self Care | Payer: Medicare Other | Source: Ambulatory Visit | Attending: Internal Medicine | Admitting: Internal Medicine

## 2015-06-06 ENCOUNTER — Other Ambulatory Visit: Payer: Self-pay | Admitting: Internal Medicine

## 2015-06-06 DIAGNOSIS — Z1239 Encounter for other screening for malignant neoplasm of breast: Secondary | ICD-10-CM

## 2015-06-06 DIAGNOSIS — Z13820 Encounter for screening for osteoporosis: Secondary | ICD-10-CM | POA: Diagnosis present

## 2015-06-06 DIAGNOSIS — Z0000 Encounter for general adult medical examination without abnormal findings: Secondary | ICD-10-CM

## 2015-06-06 DIAGNOSIS — M85851 Other specified disorders of bone density and structure, right thigh: Secondary | ICD-10-CM | POA: Diagnosis not present

## 2015-06-06 DIAGNOSIS — Z1231 Encounter for screening mammogram for malignant neoplasm of breast: Secondary | ICD-10-CM | POA: Insufficient documentation

## 2015-06-08 ENCOUNTER — Ambulatory Visit (INDEPENDENT_AMBULATORY_CARE_PROVIDER_SITE_OTHER): Payer: Medicare Other | Admitting: Internal Medicine

## 2015-06-08 ENCOUNTER — Encounter: Payer: Self-pay | Admitting: Internal Medicine

## 2015-06-08 VITALS — BP 142/90 | HR 94 | Temp 98.3°F | Wt 173.0 lb

## 2015-06-08 DIAGNOSIS — E785 Hyperlipidemia, unspecified: Secondary | ICD-10-CM | POA: Diagnosis not present

## 2015-06-08 DIAGNOSIS — E119 Type 2 diabetes mellitus without complications: Secondary | ICD-10-CM | POA: Diagnosis not present

## 2015-06-08 NOTE — Progress Notes (Signed)
Subjective:    Patient ID: Kristina Bradley, female    DOB: 09/08/1950, 64 y.o.   MRN: [id number]  HPI  Pt presents to the clinic today to follow up on her lab results. She has an A1C of 6.5% and a LDL of 160. She has never been told that she has diabetes. Her daughter has diabetes. She denies increased thirst, frequent urination, non healing wounds or numbness and tingling in her hands or feet. She would like to discuss treatment today.  Review of Systems      Past Medical History  Diagnosis Date  . Chicken pox   . Measles   . Allergy     Current Outpatient Prescriptions  Medication Sig Dispense Refill  . B Complex-C (SUPER B COMPLEX/VITAMIN C PO) Take 1 capsule by mouth daily.    Marland Kitchen. Bioflavonoid Products (ESTER-C) TABS Take 1 tablet by mouth daily.    . Calcium Carb-Cholecalciferol (CALCIUM + D3 PO) Take 1 capsule by mouth daily.    . Calcium Polycarbophil (FIBER-CAPS PO) Take 1 capsule by mouth daily.    Marland Kitchen. estrogens, conjugated, (PREMARIN) 0.3 MG tablet TAKE 1 TABLET BY MOUTH DAILY FOR 21 DAYS, THEN DO NOT TAKE FOR 7 DAYS. 30 tablet 11  . Fish Oil-Cholecalciferol (FISH OIL + D3) 1000-1000 MG-UNIT CAPS Take 2 capsules by mouth daily.    Marland Kitchen. levocetirizine (XYZAL) 5 MG tablet TAKE 1 TABLET (5 MG TOTAL) BY MOUTH EVERY EVENING. 30 tablet 11  . Misc Natural Products (OSTEO BI-FLEX JOINT SHIELD) TABS Take 1 tablet by mouth daily.     No current facility-administered medications for this visit.    Allergies  Allergen Reactions  . Codeine Hives  . Dilaudid [Hydromorphone Hcl] Hives  . Keflex [Cephalexin] Hives  . Morphine And Related Hives  . Penicillins Hives    Family History  Problem Relation Age of Onset  . Arthritis Mother   . Breast cancer Sister [id number]  . Uterine cancer Sister   . Diabetes Daughter     Social History   Social History  . Marital Status: Married    Spouse Name: N/A  . Number of Children: N/A  . Years of Education: N/A   Occupational History  . Not  on file.   Social History Main Topics  . Smoking status: Never Smoker   . Smokeless tobacco: Never Used  . Alcohol Use: 0.6 oz/week    1 Cans of beer per week     Comment: occasional  . Drug Use: No  . Sexual Activity: Not Currently   Other Topics Concern  . Not on file   Social History Narrative     Constitutional: Denies fever, malaise, fatigue, headache or abrupt weight changes.  HEENT: Denies eye pain, eye redness, ear pain, ringing in the ears, wax buildup, runny nose, nasal congestion, bloody nose, or sore throat. Respiratory: Denies difficulty breathing, shortness of breath, cough or sputum production.   Cardiovascular: Denies chest pain, chest tightness, palpitations or swelling in the hands or feet.  Gastrointestinal: Denies abdominal pain, bloating, constipation, diarrhea or blood in the stool.  GU: Denies urgency, frequency, pain with urination, burning sensation, blood in urine, odor or discharge. Skin: Denies redness, rashes, lesions or ulcercations.  Neurological: Denies dizziness, difficulty with memory, difficulty with speech or problems with balance and coordination.    No other specific complaints in a complete review of systems (except as listed in HPI above).  Objective:   Physical Exam  BP 142/90 mmHg  Pulse  94  Temp(Src) 98.3 F (36.8 C) (Oral)  Wt 173 lb (78.472 kg)  SpO2 98% Wt Readings from Last 3 Encounters:  06/08/15 173 lb (78.472 kg)  05/23/15 177 lb (80.287 kg)  05/09/15 177 lb (80.287 kg)    General: Appears her stated age, obesity in NAD. Skin: Warm, dry and intact. No rashes, lesions or ulcerations noted. HEENT: Head: normal shape and size; Eyes: sclera white, no icterus, conjunctiva pink, PERRLA and EOMs intact;  Cardiovascular: Normal rate and rhythm. S1,S2 noted.  No murmur, rubs or gallops noted.  Pulmonary/Chest: Normal effort and positive vesicular breath sounds. No respiratory distress. No wheezes, rales or ronchi noted.    Neurological: Alert and oriented. Sensation intact to BLE.   BMET    Component Value Date/Time   NA 139 05/23/2015 1513   K 3.7 05/23/2015 1513   CL 102 05/23/2015 1513   CO2 26 05/23/2015 1513   GLUCOSE 166* 05/23/2015 1513   BUN 12 05/23/2015 1513   CREATININE 0.81 05/23/2015 1513   CALCIUM 9.9 05/23/2015 1513    Lipid Panel     Component Value Date/Time   CHOL 224* 05/23/2015 1513   TRIG 228.0* 05/23/2015 1513   HDL 48.90 05/23/2015 1513   CHOLHDL 5 05/23/2015 1513   VLDL 45.6* 05/23/2015 1513    CBC    Component Value Date/Time   WBC 8.4 05/23/2015 1513   RBC 4.94 05/23/2015 1513   HGB 14.8 05/23/2015 1513   HCT 44.7 05/23/2015 1513   PLT 321.0 05/23/2015 1513   MCV 90.5 05/23/2015 1513   MCHC 33.2 05/23/2015 1513   RDW 13.1 05/23/2015 1513    Hgb A1C Lab Results  Component Value Date   HGBA1C 6.5 05/23/2015         Assessment & Plan:

## 2015-06-08 NOTE — Assessment & Plan Note (Signed)
Discussed diabetes and standards of medical care Discussed low carb diet and how exercise can reduce your blood sugar She declines all vaccines Encouraged her to get a dilated eye exam yearly Foot exam at next visit- but did discuss the importance of checking your feet daily She would like to try 3 months of lifestyle changes- will hold off on Metformin at this time LDL 160- discussed statin therapy  Make a lab appt in 3 months to repeat A1C and microalbumin

## 2015-06-08 NOTE — Progress Notes (Signed)
Pre visit review using our clinic review tool, if applicable. No additional management support is needed unless otherwise documented below in the visit note. 

## 2015-06-08 NOTE — Assessment & Plan Note (Signed)
Encouraged her to consume a low fat diet Discussed statin therapy She would like to try 3 months of lifestyle changes before starting on medications  Make a lab only appt for 3 months to repeat lipids

## 2015-06-08 NOTE — Patient Instructions (Signed)
Diabetes and Standards of Medical Care Diabetes is complicated. You may find that your diabetes team includes a dietitian, nurse, diabetes educator, eye doctor, and more. To help everyone know what is going on and to help you get the care you deserve, the following schedule of care was developed to help keep you on track. Below are the tests, exams, vaccines, medicines, education, and plans you will need. HbA1c test This test shows how well you have controlled your glucose over the past 2-3 months. It is used to see if your diabetes management plan needs to be adjusted.   It is performed at least 2 times a year if you are meeting treatment goals.  It is performed 4 times a year if therapy has changed or if you are not meeting treatment goals. Blood pressure test  This test is performed at every routine medical visit. The goal is less than 140/90 mm Hg for most people, but 130/80 mm Hg in some cases. Ask your health care provider about your goal. Dental exam  Follow up with the dentist regularly. Eye exam  If you are diagnosed with type 1 diabetes as a child, get an exam upon reaching the age of 80 years or older and having had diabetes for 3-5 years. Yearly eye exams are recommended after that initial eye exam.  If you are diagnosed with type 1 diabetes as an adult, get an exam within 5 years of diagnosis and then yearly.  If you are diagnosed with type 2 diabetes, get an exam as soon as possible after the diagnosis and then yearly. Foot care exam  Visual foot exams are performed at every routine medical visit. The exams check for cuts, injuries, or other problems with the feet.  You should have a complete foot exam performed every year. This exam includes an inspection of the structure and skin of your feet, a check of the pulses in your feet, and a check of the sensation in your feet.  Type 1 diabetes: The first exam is performed 5 years after diagnosis.  Type 2 diabetes: The first  exam is performed at the time of diagnosis.  Check your feet nightly for cuts, injuries, or other problems with your feet. Tell your health care provider if anything is not healing. Kidney function test (urine microalbumin)  This test is performed once a year.  Type 1 diabetes: The first test is performed 5 years after diagnosis.  Type 2 diabetes: The first test is performed at the time of diagnosis.  A serum creatinine and estimated glomerular filtration rate (eGFR) test is done once a year to assess the level of chronic kidney disease (CKD), if present. Lipid profile (cholesterol, HDL, LDL, triglycerides)  Performed every 5 years for most people.  The goal for LDL is less than 100 mg/dL. If you are at high risk, the goal is less than 70 mg/dL.  The goal for HDL is 40 mg/dL-50 mg/dL for men and 50 mg/dL-60 mg/dL for women. An HDL cholesterol of 60 mg/dL or higher gives some protection against heart disease.  The goal for triglycerides is less than 150 mg/dL. Immunizations  The flu (influenza) vaccine is recommended yearly for every person 30 months of age or older who has diabetes.  The pneumonia (pneumococcal) vaccine is recommended for every person 38 years of age or older who has diabetes. Adults 57 years of age or older may receive the pneumonia vaccine as a series of two separate shots.  The hepatitis B  vaccine is recommended for adults shortly after they have been diagnosed with diabetes.  The Tdap (tetanus, diphtheria, and pertussis) vaccine should be given:  According to normal childhood vaccination schedules, for children.  Every 10 years, for adults who have diabetes. Diabetes self-management education  Education is recommended at diagnosis and ongoing as needed. Treatment plan  Your treatment plan is reviewed at every medical visit.   This information is not intended to replace advice given to you by your health care provider. Make sure you discuss any questions you  have with your health care provider.   Document Released: 05/13/2009 Document Revised: 08/06/2014 Document Reviewed: 12/16/2012 Elsevier Interactive Patient Education 2016 Elsevier Inc.  

## 2015-06-15 ENCOUNTER — Encounter: Payer: Self-pay | Admitting: Internal Medicine

## 2015-06-16 ENCOUNTER — Other Ambulatory Visit: Payer: Self-pay | Admitting: Internal Medicine

## 2015-06-16 MED ORDER — BENZONATATE 200 MG PO CAPS: 200.0000 mg | ORAL_CAPSULE | Freq: Three times a day (TID) | ORAL | Status: DC | PRN

## 2015-08-03 DIAGNOSIS — H40023 Open angle with borderline findings, high risk, bilateral: Secondary | ICD-10-CM | POA: Diagnosis not present

## 2015-11-03 ENCOUNTER — Encounter: Payer: Self-pay | Admitting: Internal Medicine

## 2015-11-03 ENCOUNTER — Ambulatory Visit (INDEPENDENT_AMBULATORY_CARE_PROVIDER_SITE_OTHER): Payer: Medicare Other | Admitting: Internal Medicine

## 2015-11-03 VITALS — BP 136/82 | HR 100 | Temp 100.0°F | Wt 167.5 lb

## 2015-11-03 DIAGNOSIS — J020 Streptococcal pharyngitis: Secondary | ICD-10-CM

## 2015-11-03 DIAGNOSIS — J029 Acute pharyngitis, unspecified: Secondary | ICD-10-CM | POA: Diagnosis not present

## 2015-11-03 LAB — POCT RAPID STREP A (OFFICE): Rapid Strep A Screen: POSITIVE — AB

## 2015-11-03 MED ORDER — AZITHROMYCIN 250 MG PO TABS: ORAL_TABLET | ORAL | Status: DC

## 2015-11-03 NOTE — Progress Notes (Signed)
Pre visit review using our clinic review tool, if applicable. No additional management support is needed unless otherwise documented below in the visit note. 

## 2015-11-03 NOTE — Patient Instructions (Signed)

## 2015-11-03 NOTE — Addendum Note (Signed)
Addended by: Roena MaladyEVONTENNO, Moustafa Mossa Y on: 11/03/2015 01:45 PM   Modules accepted: Orders

## 2015-11-03 NOTE — Progress Notes (Signed)
HPI  Pt presents to the clinic today with c/o ear pain, sore throat and cough. This started yesterday. She denies decreased hearing. She does have some difficulty swallowing. The cough is non productive. She denies shortness of breath. She has had fever, chills and body aches. She has taken Xyzal and Motrin with minimal relief. She does have a history of seasonal allergies. She has had sick contacts diagnosed with strep. She did not get her flu shot.  Review of Systems      Past Medical History  Diagnosis Date  . Chicken pox   . Measles   . Allergy     Family History  Problem Relation Age of Onset  . Arthritis Mother   . Breast cancer Sister [id number]  . Uterine cancer Sister   . Diabetes Daughter     Social History   Social History  . Marital Status: Married    Spouse Name: N/A  . Number of Children: N/A  . Years of Education: N/A   Occupational History  . Not on file.   Social History Main Topics  . Smoking status: Never Smoker   . Smokeless tobacco: Never Used  . Alcohol Use: 0.6 oz/week    1 Cans of beer per week     Comment: occasional  . Drug Use: No  . Sexual Activity: Not Currently   Other Topics Concern  . Not on file   Social History Narrative    Allergies  Allergen Reactions  . Codeine Hives  . Dilaudid [Hydromorphone Hcl] Hives  . Keflex [Cephalexin] Hives  . Morphine And Related Hives  . Penicillins Hives     Constitutional: Positive fatigue and fever. Denies headache, abrupt weight changes.  HEENT:  Positive ear pain, sore throat. Denies eye redness, eye pain, pressure behind the eyes, facial pain, nasal congestion, ringing in the ears, wax buildup, runny nose or bloody nose. Respiratory: Positive cough. Denies difficulty breathing or shortness of breath.  Cardiovascular: Denies chest pain, chest tightness, palpitations or swelling in the hands or feet.   No other specific complaints in a complete review of systems (except as listed in HPI  above).  Objective:   BP 136/82 mmHg  Pulse 100  Temp(Src) 100 F (37.8 C) (Oral)  Wt 167 lb 8 oz (75.978 kg)  SpO2 99% Wt Readings from Last 3 Encounters:  11/03/15 167 lb 8 oz (75.978 kg)  06/08/15 173 lb (78.472 kg)  05/23/15 177 lb (80.287 kg)     General: Appears her stated age, in NAD. HEENT: Head: normal shape and size, no sinus tenderness noted; Eyes: sclera white, no icterus, conjunctiva pink; Ears: Tm's gray and intact, normal light reflex; Nose: mucosa pink and moist, septum midline; Throat/Mouth: + PND. Teeth present, mucosa erythematous and moist, no exudate noted, no lesions or ulcerations noted.  Neck: Cervical lymphadenopathy noted.  Cardiovascular: Normal rate and rhythm. S1,S2 noted.  No murmur, rubs or gallops noted.  Pulmonary/Chest: Normal effort and positive vesicular breath sounds. No respiratory distress. No wheezes, rales or ronchi noted.      Assessment & Plan:   Strep throat:  RST: positive Get some rest and drink plenty of water Do salt water gargles for the sore throat Ibuprofen for fever and inflammation eRx for Azithromax x 5 days   RTC as needed or if symptoms persist.

## 2016-02-12 ENCOUNTER — Encounter: Payer: Self-pay | Admitting: Internal Medicine

## 2016-02-15 ENCOUNTER — Ambulatory Visit (INDEPENDENT_AMBULATORY_CARE_PROVIDER_SITE_OTHER): Payer: Medicare Other | Admitting: Internal Medicine

## 2016-02-15 ENCOUNTER — Encounter: Payer: Self-pay | Admitting: Internal Medicine

## 2016-02-15 VITALS — BP 140/88 | HR 84 | Temp 98.8°F | Wt 167.0 lb

## 2016-02-15 DIAGNOSIS — E119 Type 2 diabetes mellitus without complications: Secondary | ICD-10-CM

## 2016-02-15 DIAGNOSIS — E669 Obesity, unspecified: Secondary | ICD-10-CM | POA: Diagnosis not present

## 2016-02-15 DIAGNOSIS — N644 Mastodynia: Secondary | ICD-10-CM

## 2016-02-15 DIAGNOSIS — E785 Hyperlipidemia, unspecified: Secondary | ICD-10-CM | POA: Diagnosis not present

## 2016-02-15 DIAGNOSIS — J302 Other seasonal allergic rhinitis: Secondary | ICD-10-CM

## 2016-02-15 LAB — MICROALBUMIN / CREATININE URINE RATIO
Creatinine,U: 87.8 mg/dL
Microalb Creat Ratio: 0.9 mg/g (ref 0.0–30.0)
Microalb, Ur: 0.8 mg/dL (ref 0.0–1.9)

## 2016-02-15 LAB — HEMOGLOBIN A1C: Hgb A1c MFr Bld: 6.2 % (ref 4.6–6.5)

## 2016-02-15 LAB — LIPID PANEL
Cholesterol: 247 mg/dL — ABNORMAL HIGH (ref 0–200)
HDL: 58.3 mg/dL (ref >39.00)
LDL Cholesterol: 156 mg/dL — ABNORMAL HIGH (ref 0–99)
NonHDL: 188.93
Total CHOL/HDL Ratio: 4
Triglycerides: 166 mg/dL — ABNORMAL HIGH (ref 0.0–149.0)
VLDL: 33.2 mg/dL (ref 0.0–40.0)

## 2016-02-15 NOTE — Assessment & Plan Note (Signed)
Will check lipid profile today If LDL not at goal, discussed starting statin therapy Encouraged her to consume a low carb, low fat diet  Advised her to start taking a baby ASA daily

## 2016-02-15 NOTE — Addendum Note (Signed)
Addended by: Lorre MunroeBAITY, REGINA W on: 02/15/2016 01:32 PM   Modules accepted: Kipp BroodSmartSet

## 2016-02-15 NOTE — Assessment & Plan Note (Addendum)
She declines referral to allergy to discuss allergy shots Continue Xyzal prn

## 2016-02-15 NOTE — Addendum Note (Signed)
Addended by: Lemoine Goyne W on: 02/15/2016 02:25 PM   Modules accepted: SmartSet  

## 2016-02-15 NOTE — Patient Instructions (Signed)

## 2016-02-15 NOTE — Progress Notes (Signed)
Pre visit review using our clinic review tool, if applicable. No additional management support is needed unless otherwise documented below in the visit note. 

## 2016-02-15 NOTE — Assessment & Plan Note (Signed)
Encouraged her to work on diet and exercise 

## 2016-02-15 NOTE — Progress Notes (Signed)
Subjective:    Patient ID: Kristina Bradley, female    DOB: 1951/04/17, 65 y.o.   MRN: [id number]  HPI  Pt presents to the clinic today to follow up chronic conditions.  Seasonal Allergies: Occur all year long. She takes Xyzal daily but does not feel like it is working as well as it used to. She can not take Allegra or Zyrtec because it makes her drowsy. She does c/o a runny nose and post nasal drip.  DM 2: Her last A1C was 6.5% 05/2015. She was not interested in starting medication at that time, but was counseled on low carb, low fat diet, and exercise for weight loss. She never returned in February 2017 to have her A1C repeated. She is taking a cinnamon supplement. She has cut back on sweets, started eating more fruits and veggies. Her last eye exam 04/2015 at [facility]. Flu never. Pneumovax never.   HLD: her last LDL was 160.0, 05/2015. She was not interested in starting cholesterol lowering medication, but she was counseled on a low fat diet. She never returned in February 2017 to have her lipids rechecked. She is taking Fish Oil OTC.  Obesity: She lost 6 lbs since her last visit. Her BMI today is 31.05. She was walking 1 mile 3-4 x a week but it started bothering her knees  And hips so she has stopped.  She also c/o left breast pain today. This started 3 weeks ago. She describes the pain as sharp and shooting at a severity of 1-2/10. It is intermittent. She has not noticed any mass or lumps. She denies redness, rash or discharge from the nipple. She has not tried anything OTC for this.  She had a mammogram 05/2015 which was normal.   Review of Systems      Past Medical History  Diagnosis Date  . Chicken pox   . Measles   . Allergy     Current Outpatient Prescriptions  Medication Sig Dispense Refill  . azithromycin (ZITHROMAX) 250 MG tablet Take 2 tabs today, then 1 tab daily x 4 days 6 tablet 0  . B Complex-C (SUPER B COMPLEX/VITAMIN C PO) Take 1 capsule by mouth daily.    Marland Kitchen  Bioflavonoid Products (ESTER-C) TABS Take 1 tablet by mouth daily.    Marland Kitchen BIOTIN PO Take 1 capsule by mouth daily.    . Calcium Carb-Cholecalciferol (CALCIUM + D3 PO) Take 1 capsule by mouth daily.    . Calcium Polycarbophil (FIBER-CAPS PO) Take 1 capsule by mouth daily.    . Cinnamon 500 MG capsule Take 500 mg by mouth daily.    Marland Kitchen estrogens, conjugated, (PREMARIN) 0.3 MG tablet TAKE 1 TABLET BY MOUTH DAILY FOR 21 DAYS, THEN DO NOT TAKE FOR 7 DAYS. 30 tablet 11  . Fish Oil-Cholecalciferol (FISH OIL + D3) 1000-1000 MG-UNIT CAPS Take 2 capsules by mouth daily.    Marland Kitchen levocetirizine (XYZAL) 5 MG tablet TAKE 1 TABLET (5 MG TOTAL) BY MOUTH EVERY EVENING. 30 tablet 11  . Misc Natural Products (OSTEO BI-FLEX JOINT SHIELD) TABS Take 1 tablet by mouth daily.     No current facility-administered medications for this visit.    Allergies  Allergen Reactions  . Codeine Hives  . Dilaudid [Hydromorphone Hcl] Hives  . Keflex [Cephalexin] Hives  . Morphine And Related Hives  . Penicillins Hives    Family History  Problem Relation Age of Onset  . Arthritis Mother   . Breast cancer Sister 48  . Uterine cancer Sister   .  Diabetes Daughter     Social History   Social History  . Marital Status: Married    Spouse Name: N/A  . Number of Children: N/A  . Years of Education: N/A   Occupational History  . Not on file.   Social History Main Topics  . Smoking status: Never Smoker   . Smokeless tobacco: Never Used  . Alcohol Use: 0.6 oz/week    1 Cans of beer per week     Comment: occasional  . Drug Use: No  . Sexual Activity: Not Currently   Other Topics Concern  . Not on file   Social History Narrative     Constitutional: Denies fever, malaise, fatigue, headache or abrupt weight changes.  HEENT: Denies eye pain, eye redness, ear pain, ringing in the ears, wax buildup, runny nose, nasal congestion, bloody nose, or sore throat. Respiratory: Denies difficulty breathing, shortness of breath,  cough or sputum production.   Cardiovascular: Denies chest pain, chest tightness, palpitations or swelling in the hands or feet.  Gastrointestinal: Denies abdominal pain, bloating, constipation, diarrhea or blood in the stool.  GU: Denies urgency, frequency, pain with urination, burning sensation, blood in urine, odor or discharge. Musculoskeletal: Denies decrease in range of motion, difficulty with gait, muscle pain or joint pain and swelling.  Skin: Pt reports left breast pain. Denies redness, rashes, lesions or ulcercations.  Neurological: Denies dizziness, difficulty with memory, difficulty with speech or problems with balance and coordination.  Psych: Denies anxiety, depression, SI/HI.  No other specific complaints in a complete review of systems (except as listed in HPI above).  Objective:   Physical Exam   BP 140/88 mmHg  Pulse 84  Temp(Src) 98.8 F (37.1 C) (Oral)  Wt 167 lb (75.751 kg)  SpO2 98% Wt Readings from Last 3 Encounters:  02/15/16 167 lb (75.751 kg)  11/03/15 167 lb 8 oz (75.978 kg)  06/08/15 173 lb (78.472 kg)    General: Appears her stated age, well developed, well nourished in NAD. Skin: Warm, dry and intact. No rashes, lesions or ulcerations noted. Breasts symmetrical. No masses or lumps noted. Cardiovascular: Normal rate and rhythm. S1,S2 noted.  No murmur, rubs or gallops noted.  No carotid bruits noted. Pulmonary/Chest: Normal effort and positive vesicular breath sounds. No respiratory distress. No wheezes, rales or ronchi noted.  Abdomen: Soft and nontender. Normal bowel sounds. No distention or masses noted.  Neurological: Alert and oriented.    BMET    Component Value Date/Time   NA 139 05/23/2015 1513   K 3.7 05/23/2015 1513   CL 102 05/23/2015 1513   CO2 26 05/23/2015 1513   GLUCOSE 166* 05/23/2015 1513   BUN 12 05/23/2015 1513   CREATININE 0.81 05/23/2015 1513   CALCIUM 9.9 05/23/2015 1513    Lipid Panel     Component Value Date/Time     CHOL 224* 05/23/2015 1513   TRIG 228.0* 05/23/2015 1513   HDL 48.90 05/23/2015 1513   CHOLHDL 5 05/23/2015 1513   VLDL 45.6* 05/23/2015 1513    CBC    Component Value Date/Time   WBC 8.4 05/23/2015 1513   RBC 4.94 05/23/2015 1513   HGB 14.8 05/23/2015 1513   HCT 44.7 05/23/2015 1513   PLT 321.0 05/23/2015 1513   MCV 90.5 05/23/2015 1513   MCHC 33.2 05/23/2015 1513   RDW 13.1 05/23/2015 1513    Hgb A1C Lab Results  Component Value Date   HGBA1C 6.5 05/23/2015        Assessment &  Plan:  Left breast pain:  Exam normal Will obtain ultrasound and diagnostic mammogram of the left breast  RTC in 3 months for your Medicare Wellness Exam

## 2016-02-15 NOTE — Assessment & Plan Note (Signed)
Will check A1C, Lipid, and microalbumin today Discussed low carb, low fat diet Encouraged yearly eye exams She declines flu and prevnar vaccines Foot exam today

## 2016-02-24 ENCOUNTER — Ambulatory Visit
Admission: RE | Admit: 2016-02-24 | Discharge: 2016-02-24 | Disposition: A | Discharge status: Home / Self Care | Payer: Medicare Other | Source: Ambulatory Visit | Attending: Internal Medicine | Admitting: Internal Medicine

## 2016-02-24 ENCOUNTER — Other Ambulatory Visit: Payer: Self-pay | Admitting: Internal Medicine

## 2016-02-24 DIAGNOSIS — N644 Mastodynia: Secondary | ICD-10-CM

## 2016-05-08 ENCOUNTER — Other Ambulatory Visit: Payer: Self-pay | Admitting: Internal Medicine

## 2016-05-30 DIAGNOSIS — H2513 Age-related nuclear cataract, bilateral: Secondary | ICD-10-CM | POA: Diagnosis not present

## 2016-05-30 DIAGNOSIS — H04123 Dry eye syndrome of bilateral lacrimal glands: Secondary | ICD-10-CM | POA: Diagnosis not present

## 2016-05-30 DIAGNOSIS — H524 Presbyopia: Secondary | ICD-10-CM | POA: Diagnosis not present

## 2016-05-30 DIAGNOSIS — H40013 Open angle with borderline findings, low risk, bilateral: Secondary | ICD-10-CM | POA: Diagnosis not present

## 2016-06-04 ENCOUNTER — Other Ambulatory Visit: Payer: Self-pay | Admitting: Internal Medicine

## 2016-07-03 ENCOUNTER — Other Ambulatory Visit: Payer: Self-pay | Admitting: Internal Medicine

## 2016-08-21 ENCOUNTER — Other Ambulatory Visit: Payer: Self-pay | Admitting: Internal Medicine

## 2016-08-21 DIAGNOSIS — Z1231 Encounter for screening mammogram for malignant neoplasm of breast: Secondary | ICD-10-CM

## 2016-09-20 ENCOUNTER — Ambulatory Visit
Admission: RE | Admit: 2016-09-20 | Discharge: 2016-09-20 | Disposition: A | Discharge status: Home / Self Care | Payer: Medicare Other | Source: Ambulatory Visit | Attending: Internal Medicine | Admitting: Internal Medicine

## 2016-09-20 DIAGNOSIS — Z1231 Encounter for screening mammogram for malignant neoplasm of breast: Secondary | ICD-10-CM | POA: Insufficient documentation

## 2016-10-31 ENCOUNTER — Telehealth: Payer: Self-pay | Admitting: Internal Medicine

## 2016-10-31 NOTE — Telephone Encounter (Signed)
Left pt message asking to call Allison back directly at [phone] to schedule AWV and CPE with PCP. °

## 2016-11-29 NOTE — Telephone Encounter (Signed)
Left pt message asking to call Allison back directly at [phone] to schedule AWV and CPE with PCP. °

## 2016-12-19 NOTE — Telephone Encounter (Signed)
Appointment 7/9 with regina

## 2017-02-04 ENCOUNTER — Ambulatory Visit (INDEPENDENT_AMBULATORY_CARE_PROVIDER_SITE_OTHER): Payer: Medicare Other | Admitting: Internal Medicine

## 2017-02-04 ENCOUNTER — Encounter: Payer: Self-pay | Admitting: Internal Medicine

## 2017-02-04 VITALS — BP 144/92 | HR 88 | Temp 97.9°F | Ht 61.75 in | Wt 171.0 lb

## 2017-02-04 DIAGNOSIS — Z1159 Encounter for screening for other viral diseases: Secondary | ICD-10-CM | POA: Diagnosis not present

## 2017-02-04 DIAGNOSIS — Z0000 Encounter for general adult medical examination without abnormal findings: Secondary | ICD-10-CM | POA: Diagnosis not present

## 2017-02-04 DIAGNOSIS — E119 Type 2 diabetes mellitus without complications: Secondary | ICD-10-CM | POA: Diagnosis not present

## 2017-02-04 DIAGNOSIS — E7800 Pure hypercholesterolemia, unspecified: Secondary | ICD-10-CM

## 2017-02-04 DIAGNOSIS — J301 Allergic rhinitis due to pollen: Secondary | ICD-10-CM

## 2017-02-04 NOTE — Patient Instructions (Signed)
Health Maintenance for Postmenopausal Women Menopause is a normal process in which your reproductive ability comes to an end. This process happens gradually over a span of months to years, usually between the ages of 22 and 9. Menopause is complete when you have missed 12 consecutive menstrual periods. It is important to talk with your health care provider about some of the most common conditions that affect postmenopausal women, such as heart disease, cancer, and bone loss (osteoporosis). Adopting a healthy lifestyle and getting preventive care can help to promote your health and wellness. Those actions can also lower your chances of developing some of these common conditions. What should I know about menopause? During menopause, you may experience a number of symptoms, such as:  Moderate-to-severe hot flashes.  Night sweats.  Decrease in sex drive.  Mood swings.  Headaches.  Tiredness.  Irritability.  Memory problems.  Insomnia.  Choosing to treat or not to treat menopausal changes is an individual decision that you make with your health care provider. What should I know about hormone replacement therapy and supplements? Hormone therapy products are effective for treating symptoms that are associated with menopause, such as hot flashes and night sweats. Hormone replacement carries certain risks, especially as you become older. If you are thinking about using estrogen or estrogen with progestin treatments, discuss the benefits and risks with your health care provider. What should I know about heart disease and stroke? Heart disease, heart attack, and stroke become more likely as you age. This may be due, in part, to the hormonal changes that your body experiences during menopause. These can affect how your body processes dietary fats, triglycerides, and cholesterol. Heart attack and stroke are both medical emergencies. There are many things that you can do to help prevent heart disease  and stroke:  Have your blood pressure checked at least every 1-2 years. High blood pressure causes heart disease and increases the risk of stroke.  If you are 53-22 years old, ask your health care provider if you should take aspirin to prevent a heart attack or a stroke.  Do not use any tobacco products, including cigarettes, chewing tobacco, or electronic cigarettes. If you need help quitting, ask your health care provider.  It is important to eat a healthy diet and maintain a healthy weight. ? Be sure to include plenty of vegetables, fruits, low-fat dairy products, and lean protein. ? Avoid eating foods that are high in solid fats, added sugars, or salt (sodium).  Get regular exercise. This is one of the most important things that you can do for your health. ? Try to exercise for at least 150 minutes each week. The type of exercise that you do should increase your heart rate and make you sweat. This is known as moderate-intensity exercise. ? Try to do strengthening exercises at least twice each week. Do these in addition to the moderate-intensity exercise.  Know your numbers.Ask your health care provider to check your cholesterol and your blood glucose. Continue to have your blood tested as directed by your health care provider.  What should I know about cancer screening? There are several types of cancer. Take the following steps to reduce your risk and to catch any cancer development as early as possible. Breast Cancer  Practice breast self-awareness. ? This means understanding how your breasts normally appear and feel. ? It also means doing regular breast self-exams. Let your health care provider know about any changes, no matter how small.  If you are 40  or older, have a clinician do a breast exam (clinical breast exam or CBE) every year. Depending on your age, family history, and medical history, it may be recommended that you also have a yearly breast X-ray (mammogram).  If you  have a family history of breast cancer, talk with your health care provider about genetic screening.  If you are at high risk for breast cancer, talk with your health care provider about having an MRI and a mammogram every year.  Breast cancer (BRCA) gene test is recommended for women who have family members with BRCA-related cancers. Results of the assessment will determine the need for genetic counseling and BRCA1 and for BRCA2 testing. BRCA-related cancers include these types: ? Breast. This occurs in males or females. ? Ovarian. ? Tubal. This may also be called fallopian tube cancer. ? Cancer of the abdominal or pelvic lining (peritoneal cancer). ? Prostate. ? Pancreatic.  Cervical, Uterine, and Ovarian Cancer Your health care provider may recommend that you be screened regularly for cancer of the pelvic organs. These include your ovaries, uterus, and vagina. This screening involves a pelvic exam, which includes checking for microscopic changes to the surface of your cervix (Pap test).  For women ages 21-65, health care providers may recommend a pelvic exam and a Pap test every three years. For women ages 79-65, they may recommend the Pap test and pelvic exam, combined with testing for human papilloma virus (HPV), every five years. Some types of HPV increase your risk of cervical cancer. Testing for HPV may also be done on women of any age who have unclear Pap test results.  Other health care providers may not recommend any screening for nonpregnant women who are considered low risk for pelvic cancer and have no symptoms. Ask your health care provider if a screening pelvic exam is right for you.  If you have had past treatment for cervical cancer or a condition that could lead to cancer, you need Pap tests and screening for cancer for at least 20 years after your treatment. If Pap tests have been discontinued for you, your risk factors (such as having a new sexual partner) need to be  reassessed to determine if you should start having screenings again. Some women have medical problems that increase the chance of getting cervical cancer. In these cases, your health care provider may recommend that you have screening and Pap tests more often.  If you have a family history of uterine cancer or ovarian cancer, talk with your health care provider about genetic screening.  If you have vaginal bleeding after reaching menopause, tell your health care provider.  There are currently no reliable tests available to screen for ovarian cancer.  Lung Cancer Lung cancer screening is recommended for adults 69-62 years old who are at high risk for lung cancer because of a history of smoking. A yearly low-dose CT scan of the lungs is recommended if you:  Currently smoke.  Have a history of at least 30 pack-years of smoking and you currently smoke or have quit within the past 15 years. A pack-year is smoking an average of one pack of cigarettes per day for one year.  Yearly screening should:  Continue until it has been 15 years since you quit.  Stop if you develop a health problem that would prevent you from having lung cancer treatment.  Colorectal Cancer  This type of cancer can be detected and can often be prevented.  Routine colorectal cancer screening usually begins at  age 42 and continues through age 45.  If you have risk factors for colon cancer, your health care provider may recommend that you be screened at an earlier age.  If you have a family history of colorectal cancer, talk with your health care provider about genetic screening.  Your health care provider may also recommend using home test kits to check for hidden blood in your stool.  A small camera at the end of a tube can be used to examine your colon directly (sigmoidoscopy or colonoscopy). This is done to check for the earliest forms of colorectal cancer.  Direct examination of the colon should be repeated every  5-10 years until age 71. However, if early forms of precancerous polyps or small growths are found or if you have a family history or genetic risk for colorectal cancer, you may need to be screened more often.  Skin Cancer  Check your skin from head to toe regularly.  Monitor any moles. Be sure to tell your health care provider: ? About any new moles or changes in moles, especially if there is a change in a mole's shape or color. ? If you have a mole that is larger than the size of a pencil eraser.  If any of your family members has a history of skin cancer, especially at a young age, talk with your health care provider about genetic screening.  Always use sunscreen. Apply sunscreen liberally and repeatedly throughout the day.  Whenever you are outside, protect yourself by wearing long sleeves, pants, a wide-brimmed hat, and sunglasses.  What should I know about osteoporosis? Osteoporosis is a condition in which bone destruction happens more quickly than new bone creation. After menopause, you may be at an increased risk for osteoporosis. To help prevent osteoporosis or the bone fractures that can happen because of osteoporosis, the following is recommended:  If you are 46-71 years old, get at least 1,000 mg of calcium and at least 600 mg of vitamin D per day.  If you are older than age 55 but younger than age 65, get at least 1,200 mg of calcium and at least 600 mg of vitamin D per day.  If you are older than age 54, get at least 1,200 mg of calcium and at least 800 mg of vitamin D per day.  Smoking and excessive alcohol intake increase the risk of osteoporosis. Eat foods that are rich in calcium and vitamin D, and do weight-bearing exercises several times each week as directed by your health care provider. What should I know about how menopause affects my mental health? Depression may occur at any age, but it is more common as you become older. Common symptoms of depression  include:  Low or sad mood.  Changes in sleep patterns.  Changes in appetite or eating patterns.  Feeling an overall lack of motivation or enjoyment of activities that you previously enjoyed.  Frequent crying spells.  Talk with your health care provider if you think that you are experiencing depression. What should I know about immunizations? It is important that you get and maintain your immunizations. These include:  Tetanus, diphtheria, and pertussis (Tdap) booster vaccine.  Influenza every year before the flu season begins.  Pneumonia vaccine.  Shingles vaccine.  Your health care provider may also recommend other immunizations. This information is not intended to replace advice given to you by your health care provider. Make sure you discuss any questions you have with your health care provider. Document Released: 09/07/2005  Document Revised: 02/03/2016 Document Reviewed: 04/19/2015 Elsevier Interactive Patient Education  2018 [employer]  

## 2017-02-04 NOTE — Assessment & Plan Note (Signed)
Advised her to continue Xyzal as needed

## 2017-02-04 NOTE — Assessment & Plan Note (Signed)
Will check A1C today Encouraged her to consume a low carb diet and exercise for weight loss Foot exam today Encouraged yearly eye exams She declines vaccines

## 2017-02-04 NOTE — Progress Notes (Signed)
HPI:  Pt presents to the clinic today for her Medicare Wellness Exam. She is also due for follow up of chronic conditions.  Seasonal Allergies: Worse in the spring and fall. She takes Xyzal as needed with good relief.  DM 2: Her last A1C was 6.2%, 01/2016. She does not check her sugars. She checks her feet daily. Her last eye exam was 04/2016.  HLD: Her last LDL was 156, 01/2016. She has opted not to take cholesterol lowering medications in the past. She tries to consume a low fat diet but reports she could be better at it.  Past Medical History:  Diagnosis Date  . Allergy   . Chicken pox   . Measles     Current Outpatient Prescriptions  Medication Sig Dispense Refill  . B Complex-C (SUPER B COMPLEX/VITAMIN C PO) Take 1 capsule by mouth daily.    Marland Kitchen. Bioflavonoid Products (ESTER-C) TABS Take 1 tablet by mouth daily.    Marland Kitchen. BIOTIN PO Take 1 capsule by mouth daily.    . Calcium Carb-Cholecalciferol (CALCIUM + D3 PO) Take 1 capsule by mouth daily.    . Calcium Polycarbophil (FIBER-CAPS PO) Take 1 capsule by mouth daily.    . Cinnamon 500 MG capsule Take 500 mg by mouth daily.    Marland Kitchen. estrogens, conjugated, (PREMARIN) 0.3 MG tablet Take 1 tablet (0.3 mg total) by mouth daily. MUST SCHEDULE ANNUAL WELLNESS EXAM 90 tablet 0  . Fish Oil-Cholecalciferol (FISH OIL + D3) 1000-1000 MG-UNIT CAPS Take 2 capsules by mouth daily.    Marland Kitchen. levocetirizine (XYZAL) 5 MG tablet TAKE ONE TABLET BY MOUTH IN THE EVENING 30 tablet 11  . Misc Natural Products (OSTEO BI-FLEX JOINT SHIELD) TABS Take 1 tablet by mouth daily.    . vitamin E 400 UNIT capsule Take 400 Units by mouth daily.     No current facility-administered medications for this visit.     Allergies  Allergen Reactions  . Codeine Hives  . Dilaudid [Hydromorphone Hcl] Hives  . Keflex [Cephalexin] Hives  . Morphine And Related Hives  . Penicillins Hives    Family History  Problem Relation Age of Onset  . Arthritis Mother   . Breast cancer Sister [id number]   . Uterine cancer Sister   . Diabetes Daughter     Social History   Social History  . Marital status: Married    Spouse name: N/A  . Number of children: N/A  . Years of education: N/A   Occupational History  . Not on file.   Social History Main Topics  . Smoking status: Never Smoker  . Smokeless tobacco: Never Used  . Alcohol use 0.6 oz/week    1 Cans of beer per week     Comment: occasional  . Drug use: No  . Sexual activity: Not Currently   Other Topics Concern  . Not on file   Social History Narrative  . No narrative on file    Hospitiliaztions: None  Health Maintenance:    Flu: never  Tetanus: > 10 years ago  Pneumovax: never  Prevnar: never  Zostavax: never  Shingrix: never  Mammogram: 08/2016  Pap Smear: hysterectomy  Bone Density: 05/2015  Colon Screening: never  Eye Doctor: yearly  Dental Exam: biannually   Providers:   PCP: Nicki Reaperegina Baity, NP-C   I have personally reviewed and have noted:  1. The patient's medical and social history 2. Their use of alcohol, tobacco or illicit drugs 3. Their current medications and supplements 4. The  patient's functional ability including ADL's, fall risks, home safety risks and hearing or visual impairment. 5. Diet and physical activities 6. Evidence for depression or mood disorder  Subjective:   Review of Systems:   Constitutional: Denies fever, malaise, fatigue, headache or abrupt weight changes.  HEENT: Denies eye pain, eye redness, ear pain, ringing in the ears, wax buildup, runny nose, nasal congestion, bloody nose, or sore throat. Respiratory: Denies difficulty breathing, shortness of breath, cough or sputum production.   Cardiovascular: Denies chest pain, chest tightness, palpitations or swelling in the hands or feet.  Gastrointestinal: Denies abdominal pain, bloating, constipation, diarrhea or blood in the stool.  GU: Denies urgency, frequency, pain with urination, burning sensation, blood in  urine, odor or discharge. Musculoskeletal: Pt reports intermittent hip pain. Denies decrease in range of motion, difficulty with gait, muscle pain or joint swelling.  Skin: Denies redness, rashes, lesions or ulcercations.  Neurological: Denies dizziness, difficulty with memory, difficulty with speech or problems with balance and coordination.  Psych: Pt reports mild anxiety. Denies depression, SI/HI.  No other specific complaints in a complete review of systems (except as listed in HPI above).  Objective:  PE:   BP (!) 144/92   Pulse 88   Temp 97.9 F (36.6 C) (Oral)   Ht 5' 1.75" (1.568 m)   Wt 171 lb (77.6 kg)   SpO2 99%   BMI 31.53 kg/m   Wt Readings from Last 3 Encounters:  02/15/16 167 lb (75.8 kg)  11/03/15 167 lb 8 oz (76 kg)  06/08/15 173 lb (78.5 kg)    General: Appears her stated age, obese in NAD. Skin: Warm, dry and intact.  HEENT: Head: normal shape and size; Eyes: sclera white, no icterus, conjunctiva pink, PERRLA and EOMs intact; Ears: Tm's gray and intact, normal light reflex; Throat/Mouth: Teeth present, mucosa pink and moist, no exudate, lesions or ulcerations noted.  Neck: Neck supple, trachea midline. No masses, lumps or thyromegaly present.  Cardiovascular: Normal rate and rhythm. S1,S2 noted.  No murmur, rubs or gallops noted. No JVD or BLE edema. No carotid bruits noted. Pulmonary/Chest: Normal effort and positive vesicular breath sounds. No respiratory distress. No wheezes, rales or ronchi noted.  Abdomen: Soft and nontender. Normal bowel sounds. No distention or masses noted.  Musculoskeletal: Strength 5/5 BUE/BLE. No signs of joint swelling.  Neurological: Alert and oriented. Cranial nerves II-XII grossly intact. Coordination normal.  Psychiatric: Mood and affect normal. She is mildly anxious appearing. Judgment and thought content normal.     BMET    Component Value Date/Time   NA 139 05/23/2015 1513   K 3.7 05/23/2015 1513   CL 102 05/23/2015  1513   CO2 26 05/23/2015 1513   GLUCOSE 166 (H) 05/23/2015 1513   BUN 12 05/23/2015 1513   CREATININE 0.81 05/23/2015 1513   CALCIUM 9.9 05/23/2015 1513    Lipid Panel     Component Value Date/Time   CHOL 247 (H) 02/15/2016 1345   TRIG 166.0 (H) 02/15/2016 1345   HDL 58.30 02/15/2016 1345   CHOLHDL 4 02/15/2016 1345   VLDL 33.2 02/15/2016 1345   LDLCALC 156 (H) 02/15/2016 1345    CBC    Component Value Date/Time   WBC 8.4 05/23/2015 1513   RBC 4.94 05/23/2015 1513   HGB 14.8 05/23/2015 1513   HCT 44.7 05/23/2015 1513   PLT 321.0 05/23/2015 1513   MCV 90.5 05/23/2015 1513   MCHC 33.2 05/23/2015 1513   RDW 13.1 05/23/2015 1513  Hgb A1C Lab Results  Component Value Date   HGBA1C 6.2 02/15/2016      Assessment and Plan:   Medicare Annual Wellness Visit:  Diet: She does eat meat. She consumes fruits and veggies daily. She tries to avoid fried foods. She drinks mostly water. Physical activity: She walks 1 mile 5 days a week. Depression/mood screen: Negative Hearing: Intact to whispered voice Visual acuity: Grossly normal, performs annual eye exam  ADLs: Capable Fall risk: None Home safety: Good Cognitive evaluation: Intact to orientation, naming, recall and repetition EOL planning: No adv directives, full code/ I agree  Preventative Medicine: She declines flu, tetanus, prevnar, pneumovax, zostovax or shingrix. She is not a candidate for pap smears. Mammogram and bone density UTD. She declines colonoscopy but is agreeable to Cologuard- ordered. Encouraged her to consume a balanced diet and exercise regimen. Advised her to see an eye doctor and dentist annually. Will check CBC, CMET, Lipid profile, A1C and Hep C today.   Next appointment: 1 year, annual exam   Nicki Reaper, NP

## 2017-02-04 NOTE — Assessment & Plan Note (Signed)
CMET and Lipid profile today Encouraged her to consume a low fat diet Will reeval statin therapy if LDL > 100

## 2017-02-05 ENCOUNTER — Other Ambulatory Visit: Payer: Self-pay | Admitting: Internal Medicine

## 2017-02-06 NOTE — Telephone Encounter (Signed)
Please advise if okay for refill.

## 2017-02-08 ENCOUNTER — Other Ambulatory Visit (INDEPENDENT_AMBULATORY_CARE_PROVIDER_SITE_OTHER): Payer: Medicare Other

## 2017-02-08 DIAGNOSIS — E7800 Pure hypercholesterolemia, unspecified: Secondary | ICD-10-CM | POA: Diagnosis not present

## 2017-02-08 DIAGNOSIS — J301 Allergic rhinitis due to pollen: Secondary | ICD-10-CM

## 2017-02-08 DIAGNOSIS — E119 Type 2 diabetes mellitus without complications: Secondary | ICD-10-CM

## 2017-02-08 DIAGNOSIS — Z1159 Encounter for screening for other viral diseases: Secondary | ICD-10-CM | POA: Diagnosis not present

## 2017-02-08 LAB — CBC
HCT: 45.8 % (ref 36.0–46.0)
Hemoglobin: 15.2 g/dL — ABNORMAL HIGH (ref 12.0–15.0)
MCHC: 33.2 g/dL (ref 30.0–36.0)
MCV: 91.3 fl (ref 78.0–100.0)
Platelets: 301 10*3/uL (ref 150.0–400.0)
RBC: 5.01 Mil/uL (ref 3.87–5.11)
RDW: 13.1 % (ref 11.5–15.5)
WBC: 6.8 10*3/uL (ref 4.0–10.5)

## 2017-02-08 LAB — COMPREHENSIVE METABOLIC PANEL
ALT: 21 U/L (ref 0–35)
AST: 19 U/L (ref 0–37)
Albumin: 4.6 g/dL (ref 3.5–5.2)
Alkaline Phosphatase: 83 U/L (ref 39–117)
BUN: 16 mg/dL (ref 6–23)
CO2: 31 mEq/L (ref 19–32)
Calcium: 10.6 mg/dL — ABNORMAL HIGH (ref 8.4–10.5)
Chloride: 100 mEq/L (ref 96–112)
Creatinine, Ser: 0.84 mg/dL (ref 0.40–1.20)
GFR: 71.93 mL/min (ref >60.00)
Glucose, Bld: 104 mg/dL — ABNORMAL HIGH (ref 70–99)
Potassium: 4.3 mEq/L (ref 3.5–5.1)
Sodium: 141 mEq/L (ref 135–145)
Total Bilirubin: 0.4 mg/dL (ref 0.2–1.2)
Total Protein: 7.8 g/dL (ref 6.0–8.3)

## 2017-02-08 LAB — HEMOGLOBIN A1C: Hgb A1c MFr Bld: 6.4 % (ref 4.6–6.5)

## 2017-02-08 LAB — LIPID PANEL
Cholesterol: 226 mg/dL — ABNORMAL HIGH (ref 0–200)
HDL: 59 mg/dL (ref >39.00)
LDL Cholesterol: 128 mg/dL — ABNORMAL HIGH (ref 0–99)
NonHDL: 166.9
Total CHOL/HDL Ratio: 4
Triglycerides: 196 mg/dL — ABNORMAL HIGH (ref 0.0–149.0)
VLDL: 39.2 mg/dL (ref 0.0–40.0)

## 2017-02-08 NOTE — Addendum Note (Signed)
Addended by: Baldomero LamyHAVERS, NATASHA C on: 02/08/2017 01:38 PM   Modules accepted: Orders

## 2017-02-09 LAB — HEPATITIS C ANTIBODY: HCV Ab: NEGATIVE

## 2017-02-11 ENCOUNTER — Other Ambulatory Visit: Payer: Medicare Other

## 2017-07-04 DIAGNOSIS — H40053 Ocular hypertension, bilateral: Secondary | ICD-10-CM | POA: Diagnosis not present

## 2017-07-04 DIAGNOSIS — H40013 Open angle with borderline findings, low risk, bilateral: Secondary | ICD-10-CM | POA: Diagnosis not present

## 2017-07-04 DIAGNOSIS — H04123 Dry eye syndrome of bilateral lacrimal glands: Secondary | ICD-10-CM | POA: Diagnosis not present

## 2017-07-04 DIAGNOSIS — H16223 Keratoconjunctivitis sicca, not specified as Sjogren's, bilateral: Secondary | ICD-10-CM | POA: Diagnosis not present

## 2017-07-04 DIAGNOSIS — H524 Presbyopia: Secondary | ICD-10-CM | POA: Diagnosis not present

## 2017-08-07 ENCOUNTER — Other Ambulatory Visit: Payer: Self-pay | Admitting: Internal Medicine

## 2017-08-07 DIAGNOSIS — Z1231 Encounter for screening mammogram for malignant neoplasm of breast: Secondary | ICD-10-CM

## 2017-08-26 ENCOUNTER — Other Ambulatory Visit: Payer: Self-pay | Admitting: Internal Medicine

## 2017-09-23 ENCOUNTER — Ambulatory Visit
Admission: RE | Admit: 2017-09-23 | Discharge: 2017-09-23 | Disposition: A | Discharge status: Home / Self Care | Payer: Medicare Other | Source: Ambulatory Visit | Attending: Internal Medicine | Admitting: Internal Medicine

## 2017-09-23 DIAGNOSIS — Z1231 Encounter for screening mammogram for malignant neoplasm of breast: Secondary | ICD-10-CM | POA: Insufficient documentation

## 2017-10-07 DIAGNOSIS — H16223 Keratoconjunctivitis sicca, not specified as Sjogren's, bilateral: Secondary | ICD-10-CM | POA: Diagnosis not present

## 2017-10-07 DIAGNOSIS — H04123 Dry eye syndrome of bilateral lacrimal glands: Secondary | ICD-10-CM | POA: Diagnosis not present

## 2018-01-02 DIAGNOSIS — H40053 Ocular hypertension, bilateral: Secondary | ICD-10-CM | POA: Diagnosis not present

## 2018-01-02 DIAGNOSIS — H40023 Open angle with borderline findings, high risk, bilateral: Secondary | ICD-10-CM | POA: Diagnosis not present

## 2018-03-15 ENCOUNTER — Encounter: Payer: Self-pay | Admitting: Internal Medicine

## 2018-05-19 ENCOUNTER — Other Ambulatory Visit: Payer: Self-pay | Admitting: Internal Medicine

## 2018-05-19 DIAGNOSIS — Z1231 Encounter for screening mammogram for malignant neoplasm of breast: Secondary | ICD-10-CM

## 2018-05-27 ENCOUNTER — Encounter: Payer: Self-pay | Admitting: Internal Medicine

## 2018-05-27 ENCOUNTER — Ambulatory Visit (INDEPENDENT_AMBULATORY_CARE_PROVIDER_SITE_OTHER): Payer: Medicare Other | Admitting: Internal Medicine

## 2018-05-27 VITALS — BP 136/80 | HR 92 | Temp 97.8°F

## 2018-05-27 DIAGNOSIS — M25561 Pain in right knee: Secondary | ICD-10-CM | POA: Diagnosis not present

## 2018-05-27 DIAGNOSIS — E7800 Pure hypercholesterolemia, unspecified: Secondary | ICD-10-CM | POA: Diagnosis not present

## 2018-05-27 DIAGNOSIS — Z0000 Encounter for general adult medical examination without abnormal findings: Secondary | ICD-10-CM

## 2018-05-27 DIAGNOSIS — E119 Type 2 diabetes mellitus without complications: Secondary | ICD-10-CM | POA: Diagnosis not present

## 2018-05-27 DIAGNOSIS — E559 Vitamin D deficiency, unspecified: Secondary | ICD-10-CM | POA: Diagnosis not present

## 2018-05-27 DIAGNOSIS — J302 Other seasonal allergic rhinitis: Secondary | ICD-10-CM | POA: Diagnosis not present

## 2018-05-27 DIAGNOSIS — M25562 Pain in left knee: Secondary | ICD-10-CM

## 2018-05-27 DIAGNOSIS — G8929 Other chronic pain: Secondary | ICD-10-CM | POA: Diagnosis not present

## 2018-05-27 LAB — CBC
HCT: 45 % (ref 36.0–46.0)
Hemoglobin: 15.3 g/dL — ABNORMAL HIGH (ref 12.0–15.0)
MCHC: 33.9 g/dL (ref 30.0–36.0)
MCV: 91.3 fl (ref 78.0–100.0)
Platelets: 289 10*3/uL (ref 150.0–400.0)
RBC: 4.94 Mil/uL (ref 3.87–5.11)
RDW: 13 % (ref 11.5–15.5)
WBC: 6 10*3/uL (ref 4.0–10.5)

## 2018-05-27 LAB — COMPREHENSIVE METABOLIC PANEL
ALT: 20 U/L (ref 0–35)
AST: 18 U/L (ref 0–37)
Albumin: 4.7 g/dL (ref 3.5–5.2)
Alkaline Phosphatase: 89 U/L (ref 39–117)
BUN: 18 mg/dL (ref 6–23)
CO2: 26 mEq/L (ref 19–32)
Calcium: 9.7 mg/dL (ref 8.4–10.5)
Chloride: 102 mEq/L (ref 96–112)
Creatinine, Ser: 0.73 mg/dL (ref 0.40–1.20)
GFR: 84.25 mL/min (ref >60.00)
Glucose, Bld: 137 mg/dL — ABNORMAL HIGH (ref 70–99)
Potassium: 3.9 mEq/L (ref 3.5–5.1)
Sodium: 138 mEq/L (ref 135–145)
Total Bilirubin: 0.6 mg/dL (ref 0.2–1.2)
Total Protein: 7.8 g/dL (ref 6.0–8.3)

## 2018-05-27 LAB — LIPID PANEL
Cholesterol: 218 mg/dL — ABNORMAL HIGH (ref 0–200)
HDL: 53.5 mg/dL (ref >39.00)
LDL Cholesterol: 128 mg/dL — ABNORMAL HIGH (ref 0–99)
NonHDL: 164.38
Total CHOL/HDL Ratio: 4
Triglycerides: 180 mg/dL — ABNORMAL HIGH (ref 0.0–149.0)
VLDL: 36 mg/dL (ref 0.0–40.0)

## 2018-05-27 LAB — MICROALBUMIN / CREATININE URINE RATIO
Creatinine,U: 56.6 mg/dL
Microalb Creat Ratio: 1.2 mg/g (ref 0.0–30.0)
Microalb, Ur: <0.7 mg/dL (ref 0.0–1.9)

## 2018-05-27 LAB — HEMOGLOBIN A1C: Hgb A1c MFr Bld: 6.4 % (ref 4.6–6.5)

## 2018-05-27 LAB — VITAMIN D 25 HYDROXY (VIT D DEFICIENCY, FRACTURES): VITD: 43.91 ng/mL (ref 30.00–100.00)

## 2018-05-27 MED ORDER — ESTROGENS CONJUGATED 0.3 MG PO TABS: 0.3000 mg | ORAL_TABLET | Freq: Every day | ORAL | 11 refills | Status: DC

## 2018-05-27 NOTE — Assessment & Plan Note (Signed)
Continue Xyzal prn 

## 2018-05-27 NOTE — Patient Instructions (Signed)
Health Maintenance for Postmenopausal Women Menopause is a normal process in which your reproductive ability comes to an end. This process happens gradually over a span of months to years, usually between the ages of 22 and 9. Menopause is complete when you have missed 12 consecutive menstrual periods. It is important to talk with your health care provider about some of the most common conditions that affect postmenopausal women, such as heart disease, cancer, and bone loss (osteoporosis). Adopting a healthy lifestyle and getting preventive care can help to promote your health and wellness. Those actions can also lower your chances of developing some of these common conditions. What should I know about menopause? During menopause, you may experience a number of symptoms, such as:  Moderate-to-severe hot flashes.  Night sweats.  Decrease in sex drive.  Mood swings.  Headaches.  Tiredness.  Irritability.  Memory problems.  Insomnia.  Choosing to treat or not to treat menopausal changes is an individual decision that you make with your health care provider. What should I know about hormone replacement therapy and supplements? Hormone therapy products are effective for treating symptoms that are associated with menopause, such as hot flashes and night sweats. Hormone replacement carries certain risks, especially as you become older. If you are thinking about using estrogen or estrogen with progestin treatments, discuss the benefits and risks with your health care provider. What should I know about heart disease and stroke? Heart disease, heart attack, and stroke become more likely as you age. This may be due, in part, to the hormonal changes that your body experiences during menopause. These can affect how your body processes dietary fats, triglycerides, and cholesterol. Heart attack and stroke are both medical emergencies. There are many things that you can do to help prevent heart disease  and stroke:  Have your blood pressure checked at least every 1-2 years. High blood pressure causes heart disease and increases the risk of stroke.  If you are 53-22 years old, ask your health care provider if you should take aspirin to prevent a heart attack or a stroke.  Do not use any tobacco products, including cigarettes, chewing tobacco, or electronic cigarettes. If you need help quitting, ask your health care provider.  It is important to eat a healthy diet and maintain a healthy weight. ? Be sure to include plenty of vegetables, fruits, low-fat dairy products, and lean protein. ? Avoid eating foods that are high in solid fats, added sugars, or salt (sodium).  Get regular exercise. This is one of the most important things that you can do for your health. ? Try to exercise for at least 150 minutes each week. The type of exercise that you do should increase your heart rate and make you sweat. This is known as moderate-intensity exercise. ? Try to do strengthening exercises at least twice each week. Do these in addition to the moderate-intensity exercise.  Know your numbers.Ask your health care provider to check your cholesterol and your blood glucose. Continue to have your blood tested as directed by your health care provider.  What should I know about cancer screening? There are several types of cancer. Take the following steps to reduce your risk and to catch any cancer development as early as possible. Breast Cancer  Practice breast self-awareness. ? This means understanding how your breasts normally appear and feel. ? It also means doing regular breast self-exams. Let your health care provider know about any changes, no matter how small.  If you are 40  or older, have a clinician do a breast exam (clinical breast exam or CBE) every year. Depending on your age, family history, and medical history, it may be recommended that you also have a yearly breast X-ray (mammogram).  If you  have a family history of breast cancer, talk with your health care provider about genetic screening.  If you are at high risk for breast cancer, talk with your health care provider about having an MRI and a mammogram every year.  Breast cancer (BRCA) gene test is recommended for women who have family members with BRCA-related cancers. Results of the assessment will determine the need for genetic counseling and BRCA1 and for BRCA2 testing. BRCA-related cancers include these types: ? Breast. This occurs in males or females. ? Ovarian. ? Tubal. This may also be called fallopian tube cancer. ? Cancer of the abdominal or pelvic lining (peritoneal cancer). ? Prostate. ? Pancreatic.  Cervical, Uterine, and Ovarian Cancer Your health care provider may recommend that you be screened regularly for cancer of the pelvic organs. These include your ovaries, uterus, and vagina. This screening involves a pelvic exam, which includes checking for microscopic changes to the surface of your cervix (Pap test).  For women ages 21-65, health care providers may recommend a pelvic exam and a Pap test every three years. For women ages 79-65, they may recommend the Pap test and pelvic exam, combined with testing for human papilloma virus (HPV), every five years. Some types of HPV increase your risk of cervical cancer. Testing for HPV may also be done on women of any age who have unclear Pap test results.  Other health care providers may not recommend any screening for nonpregnant women who are considered low risk for pelvic cancer and have no symptoms. Ask your health care provider if a screening pelvic exam is right for you.  If you have had past treatment for cervical cancer or a condition that could lead to cancer, you need Pap tests and screening for cancer for at least 20 years after your treatment. If Pap tests have been discontinued for you, your risk factors (such as having a new sexual partner) need to be  reassessed to determine if you should start having screenings again. Some women have medical problems that increase the chance of getting cervical cancer. In these cases, your health care provider may recommend that you have screening and Pap tests more often.  If you have a family history of uterine cancer or ovarian cancer, talk with your health care provider about genetic screening.  If you have vaginal bleeding after reaching menopause, tell your health care provider.  There are currently no reliable tests available to screen for ovarian cancer.  Lung Cancer Lung cancer screening is recommended for adults 69-62 years old who are at high risk for lung cancer because of a history of smoking. A yearly low-dose CT scan of the lungs is recommended if you:  Currently smoke.  Have a history of at least 30 pack-years of smoking and you currently smoke or have quit within the past 15 years. A pack-year is smoking an average of one pack of cigarettes per day for one year.  Yearly screening should:  Continue until it has been 15 years since you quit.  Stop if you develop a health problem that would prevent you from having lung cancer treatment.  Colorectal Cancer  This type of cancer can be detected and can often be prevented.  Routine colorectal cancer screening usually begins at  age 42 and continues through age 45.  If you have risk factors for colon cancer, your health care provider may recommend that you be screened at an earlier age.  If you have a family history of colorectal cancer, talk with your health care provider about genetic screening.  Your health care provider may also recommend using home test kits to check for hidden blood in your stool.  A small camera at the end of a tube can be used to examine your colon directly (sigmoidoscopy or colonoscopy). This is done to check for the earliest forms of colorectal cancer.  Direct examination of the colon should be repeated every  5-10 years until age 71. However, if early forms of precancerous polyps or small growths are found or if you have a family history or genetic risk for colorectal cancer, you may need to be screened more often.  Skin Cancer  Check your skin from head to toe regularly.  Monitor any moles. Be sure to tell your health care provider: ? About any new moles or changes in moles, especially if there is a change in a mole's shape or color. ? If you have a mole that is larger than the size of a pencil eraser.  If any of your family members has a history of skin cancer, especially at a young age, talk with your health care provider about genetic screening.  Always use sunscreen. Apply sunscreen liberally and repeatedly throughout the day.  Whenever you are outside, protect yourself by wearing long sleeves, pants, a wide-brimmed hat, and sunglasses.  What should I know about osteoporosis? Osteoporosis is a condition in which bone destruction happens more quickly than new bone creation. After menopause, you may be at an increased risk for osteoporosis. To help prevent osteoporosis or the bone fractures that can happen because of osteoporosis, the following is recommended:  If you are 46-71 years old, get at least 1,000 mg of calcium and at least 600 mg of vitamin D per day.  If you are older than age 55 but younger than age 65, get at least 1,200 mg of calcium and at least 600 mg of vitamin D per day.  If you are older than age 54, get at least 1,200 mg of calcium and at least 800 mg of vitamin D per day.  Smoking and excessive alcohol intake increase the risk of osteoporosis. Eat foods that are rich in calcium and vitamin D, and do weight-bearing exercises several times each week as directed by your health care provider. What should I know about how menopause affects my mental health? Depression may occur at any age, but it is more common as you become older. Common symptoms of depression  include:  Low or sad mood.  Changes in sleep patterns.  Changes in appetite or eating patterns.  Feeling an overall lack of motivation or enjoyment of activities that you previously enjoyed.  Frequent crying spells.  Talk with your health care provider if you think that you are experiencing depression. What should I know about immunizations? It is important that you get and maintain your immunizations. These include:  Tetanus, diphtheria, and pertussis (Tdap) booster vaccine.  Influenza every year before the flu season begins.  Pneumonia vaccine.  Shingles vaccine.  Your health care provider may also recommend other immunizations. This information is not intended to replace advice given to you by your health care provider. Make sure you discuss any questions you have with your health care provider. Document Released: 09/07/2005  Document Revised: 02/03/2016 Document Reviewed: 04/19/2015 Elsevier Interactive Patient Education  2018 [employer]  

## 2018-05-27 NOTE — Progress Notes (Signed)
HPI:  Pt presents to the clinic today for her Medicare Wellness Exam. She is also due to follow up chronic conditions.  Seasonal Allergies: Worse in the spring and fall. She takes Xyzal as needed with good relief.  DM 2: Her last A1C was 6.4%, 01/2017. She does not check her sugars. She is not taking any diabetic medication at this time. She checks her feet daily. Her last eye exam was.  HLD: Her last LDL was 128, triglycerides 196, 01/2017. She declines cholesterol lowering medication but takes Fish Oil OTC. She tries to consume a low fat diet.   Past Medical History:  Diagnosis Date  . Allergy   . Chicken pox   . Measles     Current Outpatient Medications  Medication Sig Dispense Refill  . B Complex-C (SUPER B COMPLEX/VITAMIN C PO) Take 1 capsule by mouth daily.    Marland Kitchen Bioflavonoid Products (ESTER-C) TABS Take 1 tablet by mouth daily.    Marland Kitchen BIOTIN PO Take 1 capsule by mouth daily.    . Calcium Carb-Cholecalciferol (CALCIUM + D3 PO) Take 1 capsule by mouth daily.    . Calcium Polycarbophil (FIBER-CAPS PO) Take 1 capsule by mouth daily.    . Cinnamon 500 MG capsule Take 500 mg by mouth daily.    . Fish Oil-Cholecalciferol (FISH OIL + D3) 1000-1000 MG-UNIT CAPS Take 2 capsules by mouth daily.    Marland Kitchen levocetirizine (XYZAL) 5 MG tablet TAKE ONE TABLET BY MOUTH IN THE EVENING 30 tablet 11  . Magnesium 200 MG TABS Take 1 tablet by mouth daily.    . Misc Natural Products (OSTEO BI-FLEX JOINT SHIELD) TABS Take 1 tablet by mouth daily.    Marland Kitchen PREMARIN 0.3 MG tablet TAKE 1 TABLET BY MOUTH ONCE DAILY 90 tablet 1  . vitamin E 400 UNIT capsule Take 400 Units by mouth daily.     No current facility-administered medications for this visit.     Allergies  Allergen Reactions  . Codeine Hives  . Dilaudid [Hydromorphone Hcl] Hives  . Keflex [Cephalexin] Hives  . Morphine And Related Hives  . Penicillins Hives    Family History  Problem Relation Age of Onset  . Arthritis Mother   . Breast cancer  Sister 34  . Uterine cancer Sister   . Diabetes Daughter     Social History   Socioeconomic History  . Marital status: Married    Spouse name: Not on file  . Number of children: Not on file  . Years of education: Not on file  . Highest education level: Not on file  Occupational History  . Not on file  Social Needs  . Financial resource strain: Not on file  . Food insecurity:    Worry: Not on file    Inability: Not on file  . Transportation needs:    Medical: Not on file    Non-medical: Not on file  Tobacco Use  . Smoking status: Never Smoker  . Smokeless tobacco: Never Used  Substance and Sexual Activity  . Alcohol use: Yes    Alcohol/week: 1.0 standard drinks    Types: 1 Cans of beer per week    Comment: occasional  . Drug use: No  . Sexual activity: Not Currently  Lifestyle  . Physical activity:    Days per week: Not on file    Minutes per session: Not on file  . Stress: Not on file  Relationships  . Social connections:    Talks on phone: Not on  file    Gets together: Not on file    Attends religious service: Not on file    Active member of club or organization: Not on file    Attends meetings of clubs or organizations: Not on file    Relationship status: Not on file  . Intimate partner violence:    Fear of current or ex partner: Not on file    Emotionally abused: Not on file    Physically abused: Not on file    Forced sexual activity: Not on file  Other Topics Concern  . Not on file  Social History Narrative  . Not on file    Hospitiliaztions: None  Health Maintenance:    Flu: never  Tetanus: > 10 years ago  Pneumovax: never  Prevnar: never  Shingrix: never  Mammogram: 08/2017  Pap Smear: hysterectomy  Bone Density: 05/2015  Colon Screening: never  Eye Doctor: annually  Dental Exam:   Providers:   PCP: Nicki Reaper, NP-C   I have personally reviewed and have noted:  1. The patient's medical and social history 2. Their use of alcohol,  tobacco or illicit drugs 3. Their current medications and supplements 4. The patient's functional ability including ADL's, fall risks, home safety risks and hearing or visual impairment. 5. Diet and physical activities 6. Evidence for depression or mood disorder  Subjective:   Review of Systems:   Constitutional: Denies fever, malaise, fatigue, headache or abrupt weight changes.  HEENT: Denies eye pain, eye redness, ear pain, ringing in the ears, wax buildup, runny nose, nasal congestion, bloody nose, or sore throat. Respiratory: Denies difficulty breathing, shortness of breath, cough or sputum production.   Cardiovascular: Denies chest pain, chest tightness, palpitations or swelling in the hands or feet.  Gastrointestinal: Denies abdominal pain, bloating, constipation, diarrhea or blood in the stool.  GU: Denies urgency, frequency, pain with urination, burning sensation, blood in urine, odor or discharge. Musculoskeletal: Pt reports bilateral knee pain. Denies decrease in range of motion, difficulty with gait, muscle pain or and swelling.  Skin: Denies redness, rashes, lesions or ulcercations.  Neurological: Denies dizziness, difficulty with memory, difficulty with speech or problems with balance and coordination.  Psych: Denies anxiety, depression, SI/HI.  No other specific complaints in a complete review of systems (except as listed in HPI above).  Objective:  PE:   BP 136/80   Pulse 92   Temp 97.8 F (36.6 C) (Oral)   SpO2 97%   Wt Readings from Last 3 Encounters:  02/04/17 171 lb (77.6 kg)  02/15/16 167 lb (75.8 kg)  11/03/15 167 lb 8 oz (76 kg)    General: Appears her stated age, well developed, well nourished in NAD. Skin: Warm, dry and intact. No ulcerations noted. HEENT: Head: normal shape and size; Eyes: sclera white, no icterus, conjunctiva pink, PERRLA and EOMs intact; Ears: Tm's gray and intact, normal light reflex; Throat/Mouth: Teeth present, mucosa pink and  moist, no exudate, lesions or ulcerations noted.  Neck: Neck supple, trachea midline. No masses, lumps or thyromegaly present.  Cardiovascular: Normal rate and rhythm. S1,S2 noted.  No murmur, rubs or gallops noted. No JVD or BLE edema. No carotid bruits noted. Pulmonary/Chest: Normal effort and positive vesicular breath sounds. No respiratory distress. No wheezes, rales or ronchi noted.  Abdomen: Soft and nontender. Normal bowel sounds. No distention or masses noted. Liver, spleen and kidneys non palpable. Musculoskeletal: Strength 5/5 BUE/BLE. No signs of joint swelling.  Neurological: Alert and oriented. Cranial nerves II-XII grossly  intact. Coordination normal.  Psychiatric: Mood and affect normal. Behavior is normal. Judgment and thought content normal.   BMET    Component Value Date/Time   NA 141 02/08/2017 1338   K 4.3 02/08/2017 1338   CL 100 02/08/2017 1338   CO2 31 02/08/2017 1338   GLUCOSE 104 (H) 02/08/2017 1338   BUN 16 02/08/2017 1338   CREATININE 0.84 02/08/2017 1338   CALCIUM 10.6 (H) 02/08/2017 1338    Lipid Panel     Component Value Date/Time   CHOL 226 (H) 02/08/2017 1338   TRIG 196.0 (H) 02/08/2017 1338   HDL 59.00 02/08/2017 1338   CHOLHDL 4 02/08/2017 1338   VLDL 39.2 02/08/2017 1338   LDLCALC 128 (H) 02/08/2017 1338    CBC    Component Value Date/Time   WBC 6.8 02/08/2017 1338   RBC 5.01 02/08/2017 1338   HGB 15.2 (H) 02/08/2017 1338   HCT 45.8 02/08/2017 1338   PLT 301.0 02/08/2017 1338   MCV 91.3 02/08/2017 1338   MCHC 33.2 02/08/2017 1338   RDW 13.1 02/08/2017 1338    Hgb A1C Lab Results  Component Value Date   HGBA1C 6.4 02/08/2017      Assessment and Plan:   Medicare Annual Wellness Visit:  Diet: She does eat meat. She consumes fruits and veggies daily. She tries to avoid fried foods. She drinks mostly water. Physical activity: She walks 1 mile, 5 days per week. Depression/mood screen: Negative Hearing: Intact to whispered  voice Visual acuity: Grossly normal, performs annual eye exam  ADLs: Capable Fall risk: None Home safety: Good Cognitive evaluation: Intact to orientation, naming, recall and repetition EOL planning: No adv directives, full code/ I agree  Preventative Medicine: She declines flu, tetanus, pneumovax, prevnar, zostovax, shingrix. Mammogram UTD. She declines pelvic exam, bone density. She declines colonoscopy but is agreeable to Cologuard-ordered. Encouraged her to consume a balanced diet and exercise regimen. Advised her to see an eye doctor and dentist annually. Will check CBC, CMET, Lipid, A1C, microalbumin and Vit D today.  Bilateral Knee Pain:  Likely OA She is getting relief with CBD cream Encouraged regular activity She declines xrays at this time  Next appointment: 1 year, Medicare Wellness Exam   Nicki Reaper, NP

## 2018-05-27 NOTE — Assessment & Plan Note (Signed)
A1C and microalbumin ordered Encouraged her to consume a low carb diet and exercise for weight loss Foot exam today Encouraged yearly eye exam She declines flu, pneumovax or prevnar

## 2018-05-27 NOTE — Assessment & Plan Note (Signed)
CMET and Lipid profile today Encouraged her to consume a low fat diet Continue Fish Oil for now 

## 2018-05-29 ENCOUNTER — Encounter: Payer: Self-pay | Admitting: Internal Medicine

## 2018-07-07 DIAGNOSIS — Z1212 Encounter for screening for malignant neoplasm of rectum: Secondary | ICD-10-CM | POA: Diagnosis not present

## 2018-07-07 DIAGNOSIS — Z1211 Encounter for screening for malignant neoplasm of colon: Secondary | ICD-10-CM | POA: Diagnosis not present

## 2018-07-10 DIAGNOSIS — H40023 Open angle with borderline findings, high risk, bilateral: Secondary | ICD-10-CM | POA: Diagnosis not present

## 2018-07-10 DIAGNOSIS — H04123 Dry eye syndrome of bilateral lacrimal glands: Secondary | ICD-10-CM | POA: Diagnosis not present

## 2018-07-10 DIAGNOSIS — H40053 Ocular hypertension, bilateral: Secondary | ICD-10-CM | POA: Diagnosis not present

## 2018-07-10 DIAGNOSIS — H16223 Keratoconjunctivitis sicca, not specified as Sjogren's, bilateral: Secondary | ICD-10-CM | POA: Diagnosis not present

## 2018-07-10 DIAGNOSIS — H524 Presbyopia: Secondary | ICD-10-CM | POA: Diagnosis not present

## 2018-07-10 LAB — COLOGUARD

## 2018-09-24 ENCOUNTER — Ambulatory Visit
Admission: RE | Admit: 2018-09-24 | Discharge: 2018-09-24 | Disposition: A | Discharge status: Home / Self Care | Payer: Medicare Other | Source: Ambulatory Visit | Attending: Internal Medicine | Admitting: Internal Medicine

## 2018-09-24 DIAGNOSIS — Z1231 Encounter for screening mammogram for malignant neoplasm of breast: Secondary | ICD-10-CM

## 2019-01-13 DIAGNOSIS — H40023 Open angle with borderline findings, high risk, bilateral: Secondary | ICD-10-CM | POA: Diagnosis not present

## 2019-01-13 DIAGNOSIS — H40053 Ocular hypertension, bilateral: Secondary | ICD-10-CM | POA: Diagnosis not present

## 2019-05-06 ENCOUNTER — Encounter: Payer: Self-pay | Admitting: Internal Medicine

## 2019-05-06 MED ORDER — ESTROGENS CONJUGATED 0.3 MG PO TABS: 0.3000 mg | ORAL_TABLET | Freq: Every day | ORAL | 0 refills | Status: DC

## 2019-06-02 ENCOUNTER — Other Ambulatory Visit: Payer: Self-pay

## 2019-06-02 ENCOUNTER — Encounter: Payer: Self-pay | Admitting: Internal Medicine

## 2019-06-02 ENCOUNTER — Ambulatory Visit (INDEPENDENT_AMBULATORY_CARE_PROVIDER_SITE_OTHER): Payer: Medicare Other | Admitting: Internal Medicine

## 2019-06-02 VITALS — BP 140/84 | HR 94 | Temp 97.3°F | Ht 61.5 in | Wt 166.0 lb

## 2019-06-02 DIAGNOSIS — E7800 Pure hypercholesterolemia, unspecified: Secondary | ICD-10-CM

## 2019-06-02 DIAGNOSIS — Z780 Asymptomatic menopausal state: Secondary | ICD-10-CM | POA: Diagnosis not present

## 2019-06-02 DIAGNOSIS — N951 Menopausal and female climacteric states: Secondary | ICD-10-CM | POA: Diagnosis not present

## 2019-06-02 DIAGNOSIS — I10 Essential (primary) hypertension: Secondary | ICD-10-CM

## 2019-06-02 DIAGNOSIS — E559 Vitamin D deficiency, unspecified: Secondary | ICD-10-CM | POA: Diagnosis not present

## 2019-06-02 DIAGNOSIS — Z0000 Encounter for general adult medical examination without abnormal findings: Secondary | ICD-10-CM | POA: Diagnosis not present

## 2019-06-02 DIAGNOSIS — E119 Type 2 diabetes mellitus without complications: Secondary | ICD-10-CM | POA: Diagnosis not present

## 2019-06-02 LAB — LIPID PANEL
Cholesterol: 232 mg/dL — ABNORMAL HIGH (ref 0–200)
HDL: 54.8 mg/dL (ref >39.00)
LDL Cholesterol: 140 mg/dL — ABNORMAL HIGH (ref 0–99)
NonHDL: 176.95
Total CHOL/HDL Ratio: 4
Triglycerides: 183 mg/dL — ABNORMAL HIGH (ref 0.0–149.0)
VLDL: 36.6 mg/dL (ref 0.0–40.0)

## 2019-06-02 LAB — COMPREHENSIVE METABOLIC PANEL
ALT: 19 U/L (ref 0–35)
AST: 19 U/L (ref 0–37)
Albumin: 4.5 g/dL (ref 3.5–5.2)
Alkaline Phosphatase: 85 U/L (ref 39–117)
BUN: 16 mg/dL (ref 6–23)
CO2: 26 mEq/L (ref 19–32)
Calcium: 9.7 mg/dL (ref 8.4–10.5)
Chloride: 102 mEq/L (ref 96–112)
Creatinine, Ser: 0.74 mg/dL (ref 0.40–1.20)
GFR: 77.8 mL/min (ref >60.00)
Glucose, Bld: 129 mg/dL — ABNORMAL HIGH (ref 70–99)
Potassium: 4.3 mEq/L (ref 3.5–5.1)
Sodium: 138 mEq/L (ref 135–145)
Total Bilirubin: 0.5 mg/dL (ref 0.2–1.2)
Total Protein: 7.6 g/dL (ref 6.0–8.3)

## 2019-06-02 LAB — CBC
HCT: 45.6 % (ref 36.0–46.0)
Hemoglobin: 15.3 g/dL — ABNORMAL HIGH (ref 12.0–15.0)
MCHC: 33.5 g/dL (ref 30.0–36.0)
MCV: 91.9 fl (ref 78.0–100.0)
Platelets: 286 10*3/uL (ref 150.0–400.0)
RBC: 4.96 Mil/uL (ref 3.87–5.11)
RDW: 12.9 % (ref 11.5–15.5)
WBC: 6.9 10*3/uL (ref 4.0–10.5)

## 2019-06-02 LAB — VITAMIN D 25 HYDROXY (VIT D DEFICIENCY, FRACTURES): VITD: 42.44 ng/mL (ref 30.00–100.00)

## 2019-06-02 LAB — MICROALBUMIN / CREATININE URINE RATIO
Creatinine,U: 57.5 mg/dL
Microalb Creat Ratio: 1.2 mg/g (ref 0.0–30.0)
Microalb, Ur: <0.7 mg/dL (ref 0.0–1.9)

## 2019-06-02 MED ORDER — ESTROGENS CONJUGATED 0.3 MG PO TABS: 0.3000 mg | ORAL_TABLET | Freq: Every day | ORAL | 10 refills | Status: DC

## 2019-06-02 NOTE — Assessment & Plan Note (Signed)
CMET and Lipid profile today She has refused statin therapy in the past- she is aware of increased risk of stroke, heart attack and death Continue Fish Oil Encouraged her to consume a low fat diet

## 2019-06-02 NOTE — Assessment & Plan Note (Signed)
She refuses medication therapy at this time Reinforced DASH diet and exercise for weight loss Will monitor

## 2019-06-02 NOTE — Assessment & Plan Note (Signed)
A1C and urine microalbumin today Encouraged her to consume a low carb diet Diet controlled Encouraged yearly eye exam Foot exam today She declines flu and pneumonia vaccines

## 2019-06-02 NOTE — Patient Instructions (Signed)

## 2019-06-02 NOTE — Assessment & Plan Note (Addendum)
Discussed trial of holding medication to see if she still needs it She insists she would like to continue and needs a refill today Discussed risk of HTN, stroke, heart attack, early death, or cancer- she understands the risks

## 2019-06-02 NOTE — Progress Notes (Signed)
HPI:  Pt presents to the clinic today for her annual subsequent Medicare Wellness Exam. She is also due to follow up chronic conditions.  OA: Feels like it is starting to feel worse. Mainly in her hands and knees. She is taking Osteo Bi Flex and Advil OTC.  HTN: Her BP today is 140/84. She has never been treated for HTN in the past. She denies headaches, dizziness, chest pain or shortness of breath.    HLD: Her last LDL was 128, triglycerides 180, 04/2018. She is taking Fish Oil OTC. She has refused statin therapy in the past and did not start the Red Yeast Rice OTC. She tries to consume a low fat diet.  DM 2 (pre-diabetes): Her last A1C was 6.4%. This is diet controlled. She does not monitor her sugars. She checks her feet regularly.    Past Medical History:  Diagnosis Date  . Allergy   . Chicken pox   . Measles     Current Outpatient Medications  Medication Sig Dispense Refill  . B Complex-C (SUPER B COMPLEX/VITAMIN C PO) Take 1 capsule by mouth daily.    Marland Kitchen Bioflavonoid Products (ESTER-C) TABS Take 1 tablet by mouth daily.    Marland Kitchen BIOTIN PO Take 1 capsule by mouth daily.    . Calcium Carb-Cholecalciferol (CALCIUM + D3 PO) Take 1 capsule by mouth daily.    . Calcium Polycarbophil (FIBER-CAPS PO) Take 1 capsule by mouth daily.    Marland Kitchen estrogens, conjugated, (PREMARIN) 0.3 MG tablet Take 1 tablet (0.3 mg total) by mouth daily. Take daily for 21 days then do not take for 7 days. 30 tablet 0  . Fish Oil-Cholecalciferol (FISH OIL + D3) 1000-1000 MG-UNIT CAPS Take 2 capsules by mouth daily.    Marland Kitchen levocetirizine (XYZAL) 5 MG tablet TAKE ONE TABLET BY MOUTH IN THE EVENING 30 tablet 11  . Misc Natural Products (OSTEO BI-FLEX JOINT SHIELD) TABS Take 1 tablet by mouth daily.    . vitamin E 400 UNIT capsule Take 400 Units by mouth daily.     No current facility-administered medications for this visit.     Allergies  Allergen Reactions  . Codeine Hives  . Dilaudid [Hydromorphone Hcl] Hives  .  Keflex [Cephalexin] Hives  . Morphine And Related Hives  . Penicillins Hives    Family History  Problem Relation Age of Onset  . Arthritis Mother   . Breast cancer Sister 11  . Uterine cancer Sister   . Diabetes Daughter     Social History   Socioeconomic History  . Marital status: Married    Spouse name: Not on file  . Number of children: Not on file  . Years of education: Not on file  . Highest education level: Not on file  Occupational History  . Not on file  Social Needs  . Financial resource strain: Not on file  . Food insecurity    Worry: Not on file    Inability: Not on file  . Transportation needs    Medical: Not on file    Non-medical: Not on file  Tobacco Use  . Smoking status: Never Smoker  . Smokeless tobacco: Never Used  Substance and Sexual Activity  . Alcohol use: Yes    Alcohol/week: 1.0 standard drinks    Types: 1 Cans of beer per week    Comment: occasional  . Drug use: No  . Sexual activity: Not Currently  Lifestyle  . Physical activity    Days per week: Not on  file    Minutes per session: Not on file  . Stress: Not on file  Relationships  . Social Musician on phone: Not on file    Gets together: Not on file    Attends religious service: Not on file    Active member of club or organization: Not on file    Attends meetings of clubs or organizations: Not on file    Relationship status: Not on file  . Intimate partner violence    Fear of current or ex partner: Not on file    Emotionally abused: Not on file    Physically abused: Not on file    Forced sexual activity: Not on file  Other Topics Concern  . Not on file  Social History Narrative  . Not on file    Hospitiliaztions: None. Appendectomy in 1988 or 1989.  Health Maintenance:    Flu: never  Tetanus: > 10 years ago  Pneumovax: never   Prevnar: never  Zostavax: never  Shingrix: never  Mammogram: 08/2018  Pap Smear: hysterectomy  Bone Density: 05/2015  Colon  Screening: Cologuard 2019  Eye Doctor: Madilyn Fireman   Dental Exam: Biannually   Providers:   PCP: Nicki Reaper, NP     I have personally reviewed and have noted:  1. The patient's medical and social history 2. Their use of alcohol, tobacco or illicit drugs 3. Their current medications and supplements 4. The patient's functional ability including ADL's, fall risks, home safety risks and hearing or visual impairment. 5. Diet and physical activities 6. Evidence for depression or mood disorder  Subjective:   Review of Systems:   Constitutional: Denies fever, malaise, fatigue, headache or abrupt weight changes.  HEENT: Denies eye pain, eye redness, ear pain, ringing in the ears, wax buildup, runny nose, nasal congestion, bloody nose, or sore throat. Respiratory: Denies difficulty breathing, shortness of breath, cough or sputum production.   Cardiovascular: Denies chest pain, chest tightness, palpitations or swelling in the hands or feet.  Gastrointestinal: Denies abdominal pain, bloating, constipation, diarrhea or blood in the stool.  GU: Denies urgency, frequency, pain with urination, burning sensation, blood in urine, odor or discharge. Musculoskeletal: Pt reports joint pain in hands and knees. Denies decrease in range of motion, difficulty with gait, muscle pain or joint swelling.  Skin: Denies redness, rashes, lesions or ulcercations.  Neurological: Denies dizziness, difficulty with memory, difficulty with speech or problems with balance and coordination.  Psych: Denies anxiety, depression, SI/HI.  No other specific complaints in a complete review of systems (except as listed in HPI above).  Objective:  PE:  BP 140/84   Pulse 94   Temp (!) 97.3 F (36.3 C) (Temporal)   Ht 5' 1.5" (1.562 m)   Wt 166 lb (75.3 kg)   SpO2 98%   BMI 30.86 kg/m   Wt Readings from Last 3 Encounters:  02/04/17 171 lb (77.6 kg)  02/15/16 167 lb (75.8 kg)  11/03/15 167 lb 8 oz (76 kg)     General: Appears her stated age, well developed, well nourished in NAD. Skin: Warm, dry and intact. No ulcerations noted. HEENT: Head: normal shape and size; Eyes: sclera white, no icterus, conjunctiva pink, PERRLA and EOMs intact; Ears: Tm's gray and intact, normal light reflex;  Neck: Neck supple, trachea midline. No masses, lumps or thyromegaly present.  Cardiovascular: Normal rate and rhythm. S1,S2 noted.  No murmur, rubs or gallops noted. No JVD or BLE edema. No carotid bruits noted. Pulmonary/Chest:  Normal effort and positive vesicular breath sounds. No respiratory distress. No wheezes, rales or ronchi noted.  Abdomen: Soft and nontender. Normal bowel sounds. No distention or masses noted. Liver, spleen and kidneys non palpable. Musculoskeletal: Strength 5/5 BUE/BLE. No signs of joint swelling.  Neurological: Alert and oriented. Cranial nerves II-XII grossly intact. Coordination normal.  Psychiatric: Mood and affect normal. Behavior is normal. Judgment and thought content normal.     BMET    Component Value Date/Time   NA 138 05/27/2018 0833   K 3.9 05/27/2018 0833   CL 102 05/27/2018 0833   CO2 26 05/27/2018 0833   GLUCOSE 137 (H) 05/27/2018 0833   BUN 18 05/27/2018 0833   CREATININE 0.73 05/27/2018 0833   CALCIUM 9.7 05/27/2018 0833    Lipid Panel     Component Value Date/Time   CHOL 218 (H) 05/27/2018 0833   TRIG 180.0 (H) 05/27/2018 0833   HDL 53.50 05/27/2018 0833   CHOLHDL 4 05/27/2018 0833   VLDL 36.0 05/27/2018 0833   LDLCALC 128 (H) 05/27/2018 0833    CBC    Component Value Date/Time   WBC 6.0 05/27/2018 0833   RBC 4.94 05/27/2018 0833   HGB 15.3 (H) 05/27/2018 0833   HCT 45.0 05/27/2018 0833   PLT 289.0 05/27/2018 0833   MCV 91.3 05/27/2018 0833   MCHC 33.9 05/27/2018 0833   RDW 13.0 05/27/2018 0833    Hgb A1C Lab Results  Component Value Date   HGBA1C 6.4 05/27/2018      Assessment and Plan:   Medicare Annual Wellness Visit:  Diet:  Patient eats meats. She eats fruits and veggies daily. She tries to avoid fried foods. She drinks mostly water.  Physical activity: None Depression/mood screen: Negative, PHQ 9 score of 0 Hearing: Intact to whispered voice Visual acuity: Grossly normal, performs annual eye exam  ADLs: Capable Fall risk: None Home safety: Good Cognitive evaluation: Intact to orientation, naming, recall and repetition EOL planning: Adv directives, full code/ I agree  Preventative Medicine: She declines flu, tetanus, pneumovax, prevnar, zostovax, shingrix. Mammogram UTD. She no longer needs pap smears. Bone density ordered, she will schedule with her next mammogram. Cologuard due 2022. Encouraged her to consume a balanced diet and exercise regimen. Advised her to see an eye doctor and dentist annually. Will check CBC, CMET, Lipid, A1C, urine microalbumin, and Vit D today. Due dates for screening exam given to pt as part of her AVS.   Next appointment: 6 months, follow up chronic conditions.   Nicki Reaperegina , NP

## 2019-06-03 ENCOUNTER — Telehealth: Payer: Self-pay

## 2019-06-03 DIAGNOSIS — Z1231 Encounter for screening mammogram for malignant neoplasm of breast: Secondary | ICD-10-CM

## 2019-06-03 LAB — HEMOGLOBIN A1C: Hgb A1c MFr Bld: 6.4 % (ref 4.6–6.5)

## 2019-06-03 NOTE — Telephone Encounter (Signed)
done

## 2019-06-03 NOTE — Addendum Note (Signed)
Addended by: Jearld Fenton on: 06/03/2019 02:47 PM   Modules accepted: Orders

## 2019-06-03 NOTE — Telephone Encounter (Signed)
Pt wants to wait to have her DEXA w/3D Clotilde Dieter, IMG [id number] in Feb 2021.  Can you please put in order?

## 2019-06-09 DIAGNOSIS — E7800 Pure hypercholesterolemia, unspecified: Secondary | ICD-10-CM

## 2019-06-10 MED ORDER — ATORVASTATIN CALCIUM 10 MG PO TABS: 10.0000 mg | ORAL_TABLET | Freq: Every day | ORAL | 2 refills | Status: DC

## 2019-06-24 ENCOUNTER — Other Ambulatory Visit: Payer: Self-pay

## 2019-07-22 ENCOUNTER — Encounter: Payer: Self-pay | Admitting: Internal Medicine

## 2019-07-27 MED ORDER — ROSUVASTATIN CALCIUM 5 MG PO TABS: 5.0000 mg | ORAL_TABLET | ORAL | 0 refills | Status: DC

## 2019-09-20 ENCOUNTER — Other Ambulatory Visit: Payer: Self-pay | Admitting: Internal Medicine

## 2019-09-28 ENCOUNTER — Ambulatory Visit
Admission: RE | Admit: 2019-09-28 | Discharge: 2019-09-28 | Disposition: A | Discharge status: Home / Self Care | Payer: Medicare Other | Source: Ambulatory Visit | Attending: Internal Medicine | Admitting: Internal Medicine

## 2019-09-28 DIAGNOSIS — Z780 Asymptomatic menopausal state: Secondary | ICD-10-CM | POA: Diagnosis not present

## 2019-09-28 DIAGNOSIS — Z1231 Encounter for screening mammogram for malignant neoplasm of breast: Secondary | ICD-10-CM | POA: Insufficient documentation

## 2019-09-28 DIAGNOSIS — Z13820 Encounter for screening for osteoporosis: Secondary | ICD-10-CM | POA: Diagnosis not present

## 2019-12-10 ENCOUNTER — Other Ambulatory Visit: Payer: Self-pay | Admitting: Internal Medicine

## 2019-12-11 MED ORDER — ROSUVASTATIN CALCIUM 5 MG PO TABS: ORAL_TABLET | ORAL | 1 refills | Status: DC

## 2019-12-11 NOTE — Telephone Encounter (Signed)
Lab appointment made for Lipid re check

## 2019-12-16 ENCOUNTER — Other Ambulatory Visit: Payer: Self-pay

## 2019-12-16 ENCOUNTER — Other Ambulatory Visit (INDEPENDENT_AMBULATORY_CARE_PROVIDER_SITE_OTHER): Payer: Medicare Other

## 2019-12-16 DIAGNOSIS — E7800 Pure hypercholesterolemia, unspecified: Secondary | ICD-10-CM | POA: Diagnosis not present

## 2019-12-16 LAB — COMPREHENSIVE METABOLIC PANEL
ALT: 27 U/L (ref 0–35)
AST: 26 U/L (ref 0–37)
Albumin: 4.5 g/dL (ref 3.5–5.2)
Alkaline Phosphatase: 82 U/L (ref 39–117)
BUN: 19 mg/dL (ref 6–23)
CO2: 25 mEq/L (ref 19–32)
Calcium: 9.6 mg/dL (ref 8.4–10.5)
Chloride: 101 mEq/L (ref 96–112)
Creatinine, Ser: 0.78 mg/dL (ref 0.40–1.20)
GFR: 73.1 mL/min (ref >60.00)
Glucose, Bld: 141 mg/dL — ABNORMAL HIGH (ref 70–99)
Potassium: 3.9 mEq/L (ref 3.5–5.1)
Sodium: 137 mEq/L (ref 135–145)
Total Bilirubin: 0.5 mg/dL (ref 0.2–1.2)
Total Protein: 7.6 g/dL (ref 6.0–8.3)

## 2019-12-16 LAB — LIPID PANEL
Cholesterol: 179 mg/dL (ref 0–200)
HDL: 55.5 mg/dL (ref >39.00)
LDL Cholesterol: 96 mg/dL (ref 0–99)
NonHDL: 123.98
Total CHOL/HDL Ratio: 3
Triglycerides: 138 mg/dL (ref 0.0–149.0)
VLDL: 27.6 mg/dL (ref 0.0–40.0)

## 2020-03-29 ENCOUNTER — Encounter: Payer: Self-pay | Admitting: Internal Medicine

## 2020-03-29 DIAGNOSIS — N951 Menopausal and female climacteric states: Secondary | ICD-10-CM

## 2020-03-29 MED ORDER — ROSUVASTATIN CALCIUM 5 MG PO TABS: ORAL_TABLET | ORAL | 0 refills | Status: DC

## 2020-03-29 MED ORDER — ESTROGENS CONJUGATED 0.3 MG PO TABS: 0.3000 mg | ORAL_TABLET | Freq: Every day | ORAL | 0 refills | Status: DC

## 2020-06-29 ENCOUNTER — Encounter: Payer: Self-pay | Admitting: Internal Medicine

## 2020-06-29 ENCOUNTER — Other Ambulatory Visit: Payer: Self-pay

## 2020-06-29 ENCOUNTER — Ambulatory Visit (INDEPENDENT_AMBULATORY_CARE_PROVIDER_SITE_OTHER): Payer: Medicare Other | Admitting: Internal Medicine

## 2020-06-29 VITALS — BP 140/82 | HR 93 | Temp 97.3°F | Ht 61.5 in | Wt 168.0 lb

## 2020-06-29 DIAGNOSIS — I10 Essential (primary) hypertension: Secondary | ICD-10-CM | POA: Diagnosis not present

## 2020-06-29 DIAGNOSIS — R5383 Other fatigue: Secondary | ICD-10-CM

## 2020-06-29 DIAGNOSIS — M8949 Other hypertrophic osteoarthropathy, multiple sites: Secondary | ICD-10-CM

## 2020-06-29 DIAGNOSIS — E7800 Pure hypercholesterolemia, unspecified: Secondary | ICD-10-CM

## 2020-06-29 DIAGNOSIS — H04123 Dry eye syndrome of bilateral lacrimal glands: Secondary | ICD-10-CM

## 2020-06-29 DIAGNOSIS — E039 Hypothyroidism, unspecified: Secondary | ICD-10-CM

## 2020-06-29 DIAGNOSIS — M150 Primary generalized (osteo)arthritis: Secondary | ICD-10-CM

## 2020-06-29 DIAGNOSIS — M159 Polyosteoarthritis, unspecified: Secondary | ICD-10-CM

## 2020-06-29 DIAGNOSIS — Z0000 Encounter for general adult medical examination without abnormal findings: Secondary | ICD-10-CM | POA: Diagnosis not present

## 2020-06-29 DIAGNOSIS — E559 Vitamin D deficiency, unspecified: Secondary | ICD-10-CM | POA: Diagnosis not present

## 2020-06-29 DIAGNOSIS — E119 Type 2 diabetes mellitus without complications: Secondary | ICD-10-CM

## 2020-06-29 NOTE — Progress Notes (Signed)
HPI:  Pt presents to the clinic today for her subsequent annual Medicare Wellness Exam. She is also due to follow up chronic conditions.  OA: Mainly in her hands and knees. She is taking Osteo Bi Flex and Advil OTC.  HTN: Her BP today is 140/82. She is not taking any antihypertensives at this time. ECG from reviewed.  HLD: Her last LDL was 96, 11/2019. She denies myalgias on Rosuvastatin. She is taking Fish Oil OTC. She tries to consume a low fat diet.  DM 2: Her last A1C was 6.4%, 05/2019. She is not on any oral diabetes medication. She does not check her sugars. She checks her fe etroutinely.   Past Medical History:  Diagnosis Date  . Allergy   . Chicken pox   . Measles     Current Outpatient Medications  Medication Sig Dispense Refill  . atorvastatin (LIPITOR) 10 MG tablet Take 1 tablet (10 mg total) by mouth daily. 30 tablet 2  . B Complex-C (SUPER B COMPLEX/VITAMIN C PO) Take 1 capsule by mouth daily.    Marland Kitchen Bioflavonoid Products (ESTER-C) TABS Take 1 tablet by mouth daily.    Marland Kitchen BIOTIN PO Take 1 capsule by mouth daily.    . Calcium Carb-Cholecalciferol (CALCIUM + D3 PO) Take 1 capsule by mouth daily.    . Calcium Polycarbophil (FIBER-CAPS PO) Take 1 capsule by mouth daily.    Marland Kitchen ELDERBERRY PO Take by mouth.    . estrogens, conjugated, (PREMARIN) 0.3 MG tablet Take 1 tablet (0.3 mg total) by mouth daily. 90 tablet 0  . Fish Oil-Cholecalciferol (FISH OIL + D3) 1000-1000 MG-UNIT CAPS Take 2 capsules by mouth daily.    Marland Kitchen levocetirizine (XYZAL) 5 MG tablet TAKE ONE TABLET BY MOUTH IN THE EVENING 30 tablet 11  . Magnesium 400 MG CAPS Take by mouth.    . Misc Natural Products (OSTEO BI-FLEX JOINT SHIELD) TABS Take 1 tablet by mouth daily.    . rosuvastatin (CRESTOR) 5 MG tablet TAKE ONE TABLET BY MOUTH THREE TIMES WEEKLY. TAKE ON TUESDAY, THURSDAY, AND SATURDAY 45 tablet 0  . vitamin E 400 UNIT capsule Take 400 Units by mouth daily.    . Zinc 50 MG TABS Take by mouth.     No current  facility-administered medications for this visit.    Allergies  Allergen Reactions  . Codeine Hives  . Dilaudid [Hydromorphone Hcl] Hives  . Keflex [Cephalexin] Hives  . Morphine And Related Hives  . Penicillins Hives    Family History  Problem Relation Age of Onset  . Arthritis Mother   . Breast cancer Sister 83  . Uterine cancer Sister   . Diabetes Daughter     Social History   Socioeconomic History  . Marital status: Married    Spouse name: Not on file  . Number of children: Not on file  . Years of education: Not on file  . Highest education level: Not on file  Occupational History  . Not on file  Tobacco Use  . Smoking status: Never Smoker  . Smokeless tobacco: Never Used  Substance and Sexual Activity  . Alcohol use: Yes    Alcohol/week: 1.0 standard drink    Types: 1 Cans of beer per week    Comment: occasional  . Drug use: No  . Sexual activity: Not Currently  Other Topics Concern  . Not on file  Social History Narrative  . Not on file   Social Determinants of Health   Financial Resource Strain:   .  Difficulty of Paying Living Expenses: Not on file  Food Insecurity:   . Worried About Programme researcher, broadcasting/film/video in the Last Year: Not on file  . Ran Out of Food in the Last Year: Not on file  Transportation Needs:   . Lack of Transportation (Medical): Not on file  . Lack of Transportation (Non-Medical): Not on file  Physical Activity:   . Days of Exercise per Week: Not on file  . Minutes of Exercise per Session: Not on file  Stress:   . Feeling of Stress : Not on file  Social Connections:   . Frequency of Communication with Friends and Family: Not on file  . Frequency of Social Gatherings with Friends and Family: Not on file  . Attends Religious Services: Not on file  . Active Member of Clubs or Organizations: Not on file  . Attends Banker Meetings: Not on file  . Marital Status: Not on file  Intimate Partner Violence:   . Fear of Current  or Ex-Partner: Not on file  . Emotionally Abused: Not on file  . Physically Abused: Not on file  . Sexually Abused: Not on file    Hospitiliaztions: None  Health Maintenance:    Flu: 05/2018  Tetanus: > 10 years ago  Covid: never  Pneumovax: never  Prevnar: never  Zostavax: never  Shingrix: never  Mammogram: 09/2019  Pap Smear: hysterectomy  Bone Density: 09/2019  Colon Screening: 06/2018  Eye Doctor: annually  Dental Exam: biannually   Providers:   PCP: Nicki Reaper, NP   I have personally reviewed and have noted:  1. The patient's medical and social history 2. Their use of alcohol, tobacco or illicit drugs 3. Their current medications and supplements 4. The patient's functional ability including ADL's, fall risks, home safety risks and hearing or visual impairment. 5. Diet and physical activities 6. Evidence for depression or mood disorder  Subjective:   Review of Systems:   Constitutional: Pt reports fatigue. Denies fever, malaise, headache or abrupt weight changes.  HEENT: Pt reports dry eyes. Denies eye pain, eye redness, ear pain, ringing in the ears, wax buildup, runny nose, nasal congestion, bloody nose, or sore throat. Respiratory: Denies difficulty breathing, shortness of breath, cough or sputum production.   Cardiovascular: Denies chest pain, chest tightness, palpitations or swelling in the hands or feet.  Gastrointestinal: Denies abdominal pain, bloating, constipation, diarrhea or blood in the stool.  GU: Pt reports slight urinary leakage. Denies urgency, frequency, pain with urination, burning sensation, blood in urine, odor or discharge. Musculoskeletal: Pt reports joint pain. Denies decrease in range of motion, difficulty with gait, muscle pain or joints welling.  Skin: Denies redness, rashes, lesions or ulcercations.  Neurological: Denies dizziness, difficulty with memory, difficulty with speech or problems with balance and coordination.  Psych: Denies  anxiety, depression, SI/HI.  No other specific complaints in a complete review of systems (except as listed in HPI above).  Objective:  PE:   =BP 140/82   Pulse 93   Temp (!) 97.3 F (36.3 C) (Temporal)   Ht 5' 1.5" (1.562 m)   Wt 168 lb (76.2 kg)   SpO2 98%   BMI 31.23 kg/m   Wt Readings from Last 3 Encounters:  06/02/19 166 lb (75.3 kg)  02/04/17 171 lb (77.6 kg)  02/15/16 167 lb (75.8 kg)    General: Appears her stated age, well developed, well nourished in NAD. Skin: Warm, dry and intact. No rashes or ulcerations noted. HEENT: Head:  normal shape and size; Eyes: sclera white, no icterus, conjunctiva pink, PERRLA and EOMs intact; Neck: Neck supple, trachea midline. No masses, lumps or thyromegaly present.  Cardiovascular: Normal rate and rhythm. S1,S2 noted.  No murmur, rubs or gallops noted. No JVD or BLE edema. No carotid bruits noted. Pulmonary/Chest: Normal effort and positive vesicular breath sounds. No respiratory distress. No wheezes, rales or ronchi noted.  Abdomen: Soft and nontender. Normal bowel sounds. No distention or masses noted. Liver, spleen and kidneys non palpable. Musculoskeletal: Strength 5/5 BUE/BLE. No signs of joint swelling.  Neurological: Alert and oriented. Cranial nerves II-XII grossly intact. Coordination normal.  Psychiatric: Mood and affect normal. Behavior is normal. Judgment and thought content normal.     BMET    Component Value Date/Time   NA 137 12/16/2019 0934   K 3.9 12/16/2019 0934   CL 101 12/16/2019 0934   CO2 25 12/16/2019 0934   GLUCOSE 141 (H) 12/16/2019 0934   BUN 19 12/16/2019 0934   CREATININE 0.78 12/16/2019 0934   CALCIUM 9.6 12/16/2019 0934    Lipid Panel     Component Value Date/Time   CHOL 179 12/16/2019 0934   TRIG 138.0 12/16/2019 0934   HDL 55.50 12/16/2019 0934   CHOLHDL 3 12/16/2019 0934   VLDL 27.6 12/16/2019 0934   LDLCALC 96 12/16/2019 0934    CBC    Component Value Date/Time   WBC 6.9  06/02/2019 1026   RBC 4.96 06/02/2019 1026   HGB 15.3 (H) 06/02/2019 1026   HCT 45.6 06/02/2019 1026   PLT 286.0 06/02/2019 1026   MCV 91.9 06/02/2019 1026   MCHC 33.5 06/02/2019 1026   RDW 12.9 06/02/2019 1026    Hgb A1C Lab Results  Component Value Date   HGBA1C 6.4 06/02/2019      Assessment and Plan:   Medicare Annual Wellness Visit:  Diet: She does eat meat. She consumes fruits and veggies daily. She tries to avoid fried foods. She drinks mostly water. Physical activity:  Walking Depression/mood screen: Negative, PHQ 9 score of 0 Hearing: Intact to whispered voice Visual acuity: Grossly normal, performs annual eye exam  ADLs: Capable Fall risk: None Home safety: Good Cognitive evaluation: Intact to orientation, naming, recall and repetition EOL planning: Adv directives, full code/ I agree  Preventative Medicine: She declines flu, tetanus, covid, pneumovax, prevnar or shingrix. She no longer needs pap smears. Mammogram and bone density UTD. Colon screening due 2022. Encouraged her to consume a balanced diet and exercise regimen. Advised her to see an eye doctor and dentist annually. Will check CBC, CMET, Lipid, A1C and Vit D today.  Fatigue, Dry Eyes:  Will check TSH, Vit D today Discussed testing for Sjogrens- she would like to hold off at this time   Next appointment: 6 months, follow up chronic conditions   Nicki Reaper, NP This visit occurred during the SARS-CoV-2 public health emergency.  Safety protocols were in place, including screening questions prior to the visit, additional usage of staff PPE, and extensive cleaning of exam room while observing appropriate contact time as indicated for disinfecting solutions.

## 2020-06-30 ENCOUNTER — Encounter: Payer: Self-pay | Admitting: Internal Medicine

## 2020-06-30 DIAGNOSIS — M1990 Unspecified osteoarthritis, unspecified site: Secondary | ICD-10-CM | POA: Insufficient documentation

## 2020-06-30 LAB — COMPREHENSIVE METABOLIC PANEL
ALT: 33 U/L (ref 0–35)
AST: 28 U/L (ref 0–37)
Albumin: 4.8 g/dL (ref 3.5–5.2)
Alkaline Phosphatase: 81 U/L (ref 39–117)
BUN: 16 mg/dL (ref 6–23)
CO2: 27 mEq/L (ref 19–32)
Calcium: 10.2 mg/dL (ref 8.4–10.5)
Chloride: 101 mEq/L (ref 96–112)
Creatinine, Ser: 0.79 mg/dL (ref 0.40–1.20)
GFR: 75.93 mL/min (ref >60.00)
Glucose, Bld: 104 mg/dL — ABNORMAL HIGH (ref 70–99)
Potassium: 3.9 mEq/L (ref 3.5–5.1)
Sodium: 139 mEq/L (ref 135–145)
Total Bilirubin: 0.6 mg/dL (ref 0.2–1.2)
Total Protein: 7.9 g/dL (ref 6.0–8.3)

## 2020-06-30 LAB — LDL CHOLESTEROL, DIRECT: Direct LDL: 114 mg/dL

## 2020-06-30 LAB — CBC
HCT: 45.5 % (ref 36.0–46.0)
Hemoglobin: 15.4 g/dL — ABNORMAL HIGH (ref 12.0–15.0)
MCHC: 33.9 g/dL (ref 30.0–36.0)
MCV: 91.1 fl (ref 78.0–100.0)
Platelets: 292 10*3/uL (ref 150.0–400.0)
RBC: 5 Mil/uL (ref 3.87–5.11)
RDW: 12.7 % (ref 11.5–15.5)
WBC: 7.7 10*3/uL (ref 4.0–10.5)

## 2020-06-30 LAB — VITAMIN D 25 HYDROXY (VIT D DEFICIENCY, FRACTURES): VITD: 67.41 ng/mL (ref 30.00–100.00)

## 2020-06-30 LAB — HEMOGLOBIN A1C: Hgb A1c MFr Bld: 6.7 % — ABNORMAL HIGH (ref 4.6–6.5)

## 2020-06-30 LAB — TSH: TSH: 14.62 u[IU]/mL — ABNORMAL HIGH (ref 0.35–4.50)

## 2020-06-30 NOTE — Assessment & Plan Note (Signed)
Borderline control without meds Reinforced DASH diet and exercise for weight loss Will monitor

## 2020-06-30 NOTE — Patient Instructions (Signed)

## 2020-06-30 NOTE — Assessment & Plan Note (Signed)
A1C today Encouraged her to consume a low carb low fat diet and exercise for weight loss Encouraged routine eye exams- will request copy Encouraged routine foot exams She declines vaccines

## 2020-06-30 NOTE — Assessment & Plan Note (Signed)
CMET and Lipid profile today Encouraged her to consume a low fat diet Continue Rosuvastatin and Fish Oil

## 2020-06-30 NOTE — Assessment & Plan Note (Signed)
Continue Osteo Bi Flex and Advil as needed

## 2020-07-01 NOTE — Addendum Note (Signed)
Addended by: Lorre Munroe on: 07/01/2020 01:44 PM   Modules accepted: Orders

## 2020-07-04 ENCOUNTER — Encounter: Payer: Self-pay | Admitting: Internal Medicine

## 2020-07-04 DIAGNOSIS — N951 Menopausal and female climacteric states: Secondary | ICD-10-CM

## 2020-07-04 DIAGNOSIS — E039 Hypothyroidism, unspecified: Secondary | ICD-10-CM

## 2020-07-13 DIAGNOSIS — H524 Presbyopia: Secondary | ICD-10-CM | POA: Diagnosis not present

## 2020-07-13 DIAGNOSIS — H40023 Open angle with borderline findings, high risk, bilateral: Secondary | ICD-10-CM | POA: Diagnosis not present

## 2020-07-13 DIAGNOSIS — H40053 Ocular hypertension, bilateral: Secondary | ICD-10-CM | POA: Diagnosis not present

## 2020-07-13 DIAGNOSIS — H04123 Dry eye syndrome of bilateral lacrimal glands: Secondary | ICD-10-CM | POA: Diagnosis not present

## 2020-07-13 DIAGNOSIS — H2513 Age-related nuclear cataract, bilateral: Secondary | ICD-10-CM | POA: Diagnosis not present

## 2020-07-13 DIAGNOSIS — H16223 Keratoconjunctivitis sicca, not specified as Sjogren's, bilateral: Secondary | ICD-10-CM | POA: Diagnosis not present

## 2020-07-17 MED ORDER — ROSUVASTATIN CALCIUM 5 MG PO TABS: ORAL_TABLET | ORAL | 0 refills | Status: DC

## 2020-07-17 MED ORDER — ESTROGENS CONJUGATED 0.3 MG PO TABS: 0.3000 mg | ORAL_TABLET | Freq: Every day | ORAL | 0 refills | Status: DC

## 2020-08-12 ENCOUNTER — Other Ambulatory Visit (INDEPENDENT_AMBULATORY_CARE_PROVIDER_SITE_OTHER): Payer: Medicare Other

## 2020-08-12 ENCOUNTER — Other Ambulatory Visit: Payer: Self-pay

## 2020-08-12 DIAGNOSIS — E039 Hypothyroidism, unspecified: Secondary | ICD-10-CM

## 2020-08-12 LAB — T4, FREE: Free T4: 0.76 ng/dL (ref 0.60–1.60)

## 2020-08-12 LAB — TSH: TSH: 13.53 u[IU]/mL — ABNORMAL HIGH (ref 0.35–4.50)

## 2020-08-24 ENCOUNTER — Encounter: Payer: Self-pay | Admitting: Internal Medicine

## 2020-09-02 ENCOUNTER — Other Ambulatory Visit: Payer: Self-pay | Admitting: Internal Medicine

## 2020-09-02 DIAGNOSIS — Z1231 Encounter for screening mammogram for malignant neoplasm of breast: Secondary | ICD-10-CM

## 2020-10-25 ENCOUNTER — Other Ambulatory Visit: Payer: Self-pay | Admitting: Internal Medicine

## 2020-10-25 ENCOUNTER — Ambulatory Visit
Admission: RE | Admit: 2020-10-25 | Discharge: 2020-10-25 | Disposition: A | Discharge status: Home / Self Care | Payer: Medicare Other | Source: Ambulatory Visit | Attending: Internal Medicine | Admitting: Internal Medicine

## 2020-10-25 ENCOUNTER — Other Ambulatory Visit: Payer: Self-pay

## 2020-10-25 DIAGNOSIS — Z1231 Encounter for screening mammogram for malignant neoplasm of breast: Secondary | ICD-10-CM | POA: Diagnosis not present

## 2020-10-25 DIAGNOSIS — N951 Menopausal and female climacteric states: Secondary | ICD-10-CM

## 2020-10-26 MED ORDER — ESTROGENS CONJUGATED 0.3 MG PO TABS: 0.3000 mg | ORAL_TABLET | Freq: Every day | ORAL | 0 refills | Status: DC

## 2020-10-26 MED ORDER — ROSUVASTATIN CALCIUM 5 MG PO TABS: ORAL_TABLET | ORAL | 0 refills | Status: DC

## 2020-11-07 ENCOUNTER — Encounter: Payer: Self-pay | Admitting: Internal Medicine

## 2020-11-07 ENCOUNTER — Ambulatory Visit (INDEPENDENT_AMBULATORY_CARE_PROVIDER_SITE_OTHER): Payer: Medicare Other | Admitting: Internal Medicine

## 2020-11-07 ENCOUNTER — Other Ambulatory Visit: Payer: Self-pay

## 2020-11-07 VITALS — BP 140/96 | HR 99 | Temp 96.5°F | Wt 161.0 lb

## 2020-11-07 DIAGNOSIS — M5441 Lumbago with sciatica, right side: Secondary | ICD-10-CM

## 2020-11-07 DIAGNOSIS — F321 Major depressive disorder, single episode, moderate: Secondary | ICD-10-CM | POA: Diagnosis not present

## 2020-11-07 DIAGNOSIS — G8929 Other chronic pain: Secondary | ICD-10-CM | POA: Diagnosis not present

## 2020-11-07 DIAGNOSIS — F4321 Adjustment disorder with depressed mood: Secondary | ICD-10-CM | POA: Diagnosis not present

## 2020-11-07 DIAGNOSIS — F5104 Psychophysiologic insomnia: Secondary | ICD-10-CM

## 2020-11-07 DIAGNOSIS — N951 Menopausal and female climacteric states: Secondary | ICD-10-CM

## 2020-11-07 DIAGNOSIS — E7800 Pure hypercholesterolemia, unspecified: Secondary | ICD-10-CM | POA: Diagnosis not present

## 2020-11-07 DIAGNOSIS — R7989 Other specified abnormal findings of blood chemistry: Secondary | ICD-10-CM

## 2020-11-07 DIAGNOSIS — R630 Anorexia: Secondary | ICD-10-CM

## 2020-11-07 DIAGNOSIS — Z634 Disappearance and death of family member: Secondary | ICD-10-CM | POA: Diagnosis not present

## 2020-11-07 LAB — T4, FREE: Free T4: 0.78 ng/dL (ref 0.60–1.60)

## 2020-11-07 LAB — TSH: TSH: 7.24 u[IU]/mL — ABNORMAL HIGH (ref 0.35–4.50)

## 2020-11-07 MED ORDER — FLUOXETINE HCL 20 MG PO TABS: ORAL_TABLET | ORAL | 1 refills | Status: AC

## 2020-11-07 MED ORDER — ESTROGENS CONJUGATED 0.3 MG PO TABS: 0.3000 mg | ORAL_TABLET | Freq: Every day | ORAL | 0 refills | Status: DC

## 2020-11-07 MED ORDER — ROSUVASTATIN CALCIUM 5 MG PO TABS: ORAL_TABLET | ORAL | 1 refills | Status: DC

## 2020-11-07 MED ORDER — CYCLOBENZAPRINE HCL 5 MG PO TABS: 5.0000 mg | ORAL_TABLET | Freq: Every evening | ORAL | 0 refills | Status: DC | PRN

## 2020-11-07 NOTE — Patient Instructions (Signed)
Complicated Grief Grief is a normal response to the death of someone close to you. Feelings of fear, anger, and guilt can affect almost everyone who loses a loved one. It is also common to have symptoms of depression while you are grieving. These include problems with sleep, loss of appetite, and lack of energy. They may last for weeks or months after a loss. Complicated grief is different from normal grief or depression. Normal grieving involves sadness and feelings of loss, but those feelings get better and heal over time. Complicated grief is a severe type of grief that lasts for a long time, usually for several months to a year or longer. It interferes with your ability to function normally. Complicated grief may require treatment from a mental health care provider. What are the causes? The cause of this condition is not known. It is not clear why some people continue to struggle with grief and others do not. What increases the risk? You are more likely to develop this condition if:  The death of your loved one was sudden or unexpected.  The death of your loved one was due to a violent event.  Your loved one died from suicide.  Your loved one was a child or a young person.  You were very close to your loved one, or you were dependent on him or her.  You have a history of depression or anxiety. What are the signs or symptoms? Symptoms of this condition include:  Feeling disbelief or having a lack of emotion (numbness).  Being unable to enjoy good memories of your loved one.  Needing to avoid anything or anyone that reminds you of your loved one.  Being unable to stop thinking about the death.  Feeling intense anger or guilt.  Feeling alone and hopeless.  Feeling that your life is meaningless and empty.  Losing the desire to move on with your life. How is this diagnosed? This condition may be diagnosed based on:  Your symptoms. Complicated grief will be diagnosed if you have  ongoing symptoms of grief for 6-12 months or longer.  The effect of symptoms on your life. You may be diagnosed with this condition if your symptoms are interfering with your ability to live your life. Your health care provider may recommend that you see a mental health care provider. Many symptoms of depression are similar to the symptoms of complicated grief. It is important to be evaluated for complicated grief along with other mental health conditions. How is this treated? This condition is most commonly treated with talk therapy. This therapy is offered by a mental health specialist (psychiatrist). During therapy:  You will learn healthy ways to cope with the loss of your loved one.  Your mental health care provider may recommend antidepressant medicines.   Follow these instructions at home: Lifestyle  Take care of yourself. ? Eat on a regular basis, and maintain a healthy diet. Eat plenty of fruits, vegetables, lean protein, and whole grains. ? Try to get some exercise each day. Aim for 30 minutes of exercise on most days of the week. ? Keep a consistent sleep schedule. Try to get 8 or more hours of sleep each night. ? Start doing the things that you used to enjoy.  Do not use drugs or alcohol to ease your symptoms.  Spend time with friends and loved ones.   General instructions  Take over-the-counter and prescription medicines only as told by your health care provider.  Consider joining a grief (  bereavement) support group to help you deal with your loss.  Keep all follow-up visits as told by your health care provider. This is important. Contact a health care provider if:  Your symptoms prevent you from functioning normally.  Your symptoms do not get better with treatment. Get help right away if:  You have serious thoughts about hurting yourself or someone else.  You have suicidal feelings. If you ever feel like you may hurt yourself or others, or have thoughts about  taking your own life, get help right away. You can go to your nearest emergency department or call:  Your local emergency services (911 in the [place]  A suicide crisis helpline, such as the National Suicide Prevention Lifeline at 1-800-273-8255. This is open 24 hours a day. Summary  Complicated grief is a severe type of grief that lasts for a long time. This grief is not likely to go away on its own. Get the help you need.  Some griefs are more difficult than others and can cause this condition. You may need a certain type of treatment to help you recover if the loss of your loved one was sudden, violent, or due to suicide.  You may feel guilty about moving on with your life. Getting help does not mean that you are forgetting your loved one. It means that you are taking care of yourself.  Complicated grief is best treated with talk therapy. Medicines may also be prescribed.  Seek the help you need, and find support that will help you recover. This information is not intended to replace advice given to you by your health care provider. Make sure you discuss any questions you have with your health care provider. Document Revised: 01/07/2020 Document Reviewed: 01/07/2020 Elsevier Patient Education  2021 Elsevier Inc.  

## 2020-11-07 NOTE — Assessment & Plan Note (Signed)
Rosuvastatin refilled today

## 2020-11-07 NOTE — Progress Notes (Signed)
Subjective:    Patient ID: Kristina Bradley, female    DOB: [birth year], 70 y.o.   MRN: [id number]  HPI  Pt presents to the clinic today with c/o right sided low back pain. She reports this started 2 months. She describes the pain as sharp and shooting. The pain is worse after standing for long periods of time. The pain radiates into her RLE. She reports associated numbness but denies tingling or weakness of the RLE. She denies any injury to the area, loss of bowel or bladder control. She has tried Ibuprofen and TENS unit with some relief of symptoms.   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 She is having trouble sleeping, has no desire to eat. She has a good support system and has not undergone grief counseling. She has been on Fluoxetine and Ambien in the past. She thinks she may want to restart Fluoxetine but not Ambien.  She is due for repeat thyroid studies. Her TSH was abnormal. She is not on thyroid medication at this time.  She also needs refills of Premarin and Rosuvastatin.  Review of Systems  Past Medical History:  Diagnosis Date  . Allergy   . Chicken pox   . Measles     Current Outpatient Medications  Medication Sig Dispense Refill  . B Complex-C (SUPER B COMPLEX/VITAMIN C PO) Take 1 capsule by mouth daily.    Marland Kitchen Bioflavonoid Products (ESTER-C) TABS Take 1 tablet by mouth daily.    Marland Kitchen BIOTIN PO Take 1 capsule by mouth daily.    . Calcium Carb-Cholecalciferol (CALCIUM + D3 PO) Take 1 capsule by mouth daily.    . Calcium Polycarbophil (FIBER-CAPS PO) Take 1 capsule by mouth daily.    . Cholecalciferol (VITAMIN D3) 25 MCG (1000 UT) CAPS Take by mouth.    . Coenzyme Q10-Red Yeast Rice (CO Q-10 PLUS RED YEAST RICE) 60-600 MG CAPS Take by mouth.    . ELDERBERRY PO Take by mouth.    . estrogens, conjugated, (PREMARIN) 0.3 MG tablet Take 1 tablet (0.3 mg total) by mouth daily. 90 tablet 0  . Fish Oil-Cholecalciferol (FISH OIL + D3) 1000-1000  MG-UNIT CAPS Take 2 capsules by mouth daily.    Marland Kitchen levocetirizine (XYZAL) 5 MG tablet TAKE ONE TABLET BY MOUTH IN THE EVENING 30 tablet 11  . Magnesium 400 MG CAPS Take by mouth.    . Misc Natural Products (OSTEO BI-FLEX JOINT SHIELD) TABS Take 1 tablet by mouth daily.    Marland Kitchen PREMARIN 0.3 MG tablet Take 1 tablet by mouth once daily 90 tablet 0  . rosuvastatin (CRESTOR) 5 MG tablet TAKE ONE TABLET BY MOUTH THREE TIMES A WEEK. TAKE ON TUESDAY, THURSDAY AND SATURDAY 45 tablet 0  . rosuvastatin (CRESTOR) 5 MG tablet TAKE ONE TABLET BY MOUTH THREE TIMES WEEKLY. TAKE ON TUESDAY, THURSDAY, AND SATURDAY 45 tablet 0  . vitamin E 400 UNIT capsule Take 400 Units by mouth daily.    . Zinc 50 MG TABS Take by mouth.     No current facility-administered medications for this visit.    Allergies  Allergen Reactions  . Codeine Hives  . Dilaudid [Hydromorphone Hcl] Hives  . Keflex [Cephalexin] Hives  . Morphine And Related Hives  . Penicillins Hives    Family History  Problem Relation Age of Onset  . Arthritis Mother   . Breast cancer Sister 49  . Uterine cancer Sister   . Diabetes Daughter  Social History   Socioeconomic History  . Marital status: Married    Spouse name: Not on file  . Number of children: Not on file  . Years of education: Not on file  . Highest education level: Not on file  Occupational History  . Not on file  Tobacco Use  . Smoking status: Never Smoker  . Smokeless tobacco: Never Used  Substance and Sexual Activity  . Alcohol use: Yes    Alcohol/week: 1.0 standard drink    Types: 1 Cans of beer per week    Comment: occasional  . Drug use: No  . Sexual activity: Not Currently  Other Topics Concern  . Not on file  Social History Narrative  . Not on file   Social Determinants of Health   Financial Resource Strain: Not on file  Food Insecurity: Not on file  Transportation Needs: Not on file  Physical Activity: Not on file  Stress: Not on file  Social  Connections: Not on file  Intimate Partner Violence: Not on file     Constitutional: Pt reports fatigue. Denies fever, malaise, headache or abrupt weight changes.  Respiratory: Denies difficulty breathing, shortness of breath, cough or sputum production.   Cardiovascular: Denies chest pain, chest tightness, palpitations or swelling in the hands or feet.  Gastrointestinal: Denies loss of bowel control.  GU: Denies loss of bladder control. Musculoskeletal: Pt reports right sided low back pain. Denies decrease in range of motion, difficulty with gait, or joint swelling.  Neurological: Pt reports numbness in RLE, insomnia. Denies dizziness, difficulty with memory, difficulty with speech or problems with balance and coordination.  Psych: Pt reports depression, grief. Denies anxiety, SI/HI.  No other specific complaints in a complete review of systems (except as listed in HPI above).     Objective:   Physical Exam  BP (!) 140/96   Pulse 99   Temp (!) 96.5 F (35.8 C) (Temporal)   Wt 161 lb (73 kg)   SpO2 98%   BMI 29.93 kg/m   Wt Readings from Last 3 Encounters:  06/29/20 168 lb (76.2 kg)  06/02/19 166 lb (75.3 kg)  02/04/17 171 lb (77.6 kg)    General: Appears her stated age, well developed, well nourished in NAD. HEENT: Head: normal shape and size;  Cardiovascular: Normal rate. Pulmonary/Chest: Normal effort. Musculoskeletal: Normal flexion, extension and rotation of the spine. No bony tenderness noted over the spine. No difficulty with gait.  Neurological: Alert and oriented. Marland Kitchen  Psychiatric: Tearful. Behavior is normal. Judgment and thought content normal.   EKG:  BMET    Component Value Date/Time   NA 139 06/29/2020 1507   K 3.9 06/29/2020 1507   CL 101 06/29/2020 1507   CO2 27 06/29/2020 1507   GLUCOSE 104 (H) 06/29/2020 1507   BUN 16 06/29/2020 1507   CREATININE 0.79 06/29/2020 1507   CALCIUM 10.2 06/29/2020 1507    Lipid Panel     Component Value Date/Time    CHOL 179 12/16/2019 0934   TRIG 138.0 12/16/2019 0934   HDL 55.50 12/16/2019 0934   CHOLHDL 3 12/16/2019 0934   VLDL 27.6 12/16/2019 0934   LDLCALC 96 12/16/2019 0934    CBC    Component Value Date/Time   WBC 7.7 06/29/2020 1507   RBC 5.00 06/29/2020 1507   HGB 15.4 (H) 06/29/2020 1507   HCT 45.5 06/29/2020 1507   PLT 292.0 06/29/2020 1507   MCV 91.1 06/29/2020 1507   MCHC 33.9 06/29/2020 1507  RDW 12.7 06/29/2020 1507    Hgb A1C Lab Results  Component Value Date   HGBA1C 6.7 (H) 06/29/2020           Assessment & Plan:   Midline Low Back Pain with Right Sided Sciatica:  She declines Prednisone as she is unable to tolerate RX for Flexeril 5 mg QHS prn- sedation caution given Encouraged stretching, ice Consider xray lumbar spine and PT if symptoms persist or worsen  Depression, Grief due to Loss of Child:  Support offered She declines referral for grief counseling at this time RX for Fluoxetine 10 mg daily x 3 weeks then increase to 20 mg daily thereafter  Abnormal TSH:  TSH and Free T4 today  Nicki Reaper, NP This visit occurred during the SARS-CoV-2 public health emergency.  Safety protocols were in place, including screening questions prior to the visit, additional usage of staff PPE, and extensive cleaning of exam room while observing appropriate contact time as indicated for disinfecting solutions.

## 2020-11-08 ENCOUNTER — Encounter: Payer: Self-pay | Admitting: Internal Medicine

## 2020-11-10 MED ORDER — LEVOTHYROXINE SODIUM 25 MCG PO TABS: 25.0000 ug | ORAL_TABLET | Freq: Every day | ORAL | 0 refills | Status: DC

## 2020-11-10 NOTE — Addendum Note (Signed)
Addended by: Roena Malady on: 11/10/2020 05:53 PM   Modules accepted: Orders

## 2020-12-01 ENCOUNTER — Other Ambulatory Visit: Payer: Self-pay | Admitting: Family Medicine

## 2020-12-01 ENCOUNTER — Telehealth: Payer: Medicare Other | Admitting: Family Medicine

## 2020-12-01 ENCOUNTER — Other Ambulatory Visit: Payer: Self-pay

## 2020-12-01 ENCOUNTER — Other Ambulatory Visit: Payer: Medicare Other

## 2020-12-01 ENCOUNTER — Encounter: Payer: Self-pay | Admitting: Family Medicine

## 2020-12-01 ENCOUNTER — Telehealth (INDEPENDENT_AMBULATORY_CARE_PROVIDER_SITE_OTHER): Payer: Medicare Other | Admitting: Family Medicine

## 2020-12-01 DIAGNOSIS — Z20822 Contact with and (suspected) exposure to covid-19: Secondary | ICD-10-CM | POA: Diagnosis not present

## 2020-12-01 MED ORDER — NIRMATRELVIR/RITONAVIR (PAXLOVID)TABLET: ORAL_TABLET | ORAL | 0 refills | Status: AC

## 2020-12-01 NOTE — Progress Notes (Signed)
Virtual Visit via Telephone Note  I connected with Kristina Bradley on 12/01/20 at 12:20 PM EDT by telephone and verified that I am speaking with the correct person using two identifiers.   I discussed the limitations, risks, security and privacy concerns of performing an evaluation and management service by telephone and the availability of in person appointments. I also discussed with the patient that there may be a patient responsible charge related to this service. The patient expressed understanding and agreed to proceed.  Patient location: Home Provider Location: [facility] Participants: Lynnda Child and Kristina Bradley   History of Present Illness: Chief Complaint  Patient presents with  . Sinusitis    X 5 days  . Headache  . Covid Exposure    Last week to family and now husband is positive   . Cough  . Fever    Mon and tues     Sinusitis Episode onset: 11/27/2020. Associated symptoms include chills (resolved), coughing, ear pain, headaches, sinus pressure and a sore throat. Pertinent negatives include no shortness of breath.  Headache  Associated symptoms include coughing, ear pain, a fever (resolved now), sinus pressure and a sore throat.  Cough The cough is non-productive. Associated symptoms include chills (resolved), ear pain, a fever (resolved now), headaches, nasal congestion and a sore throat. Pertinent negatives include no myalgias, shortness of breath or wheezing.  Fever  Associated symptoms include coughing, ear pain, headaches and a sore throat. Pertinent negatives include no wheezing.   Negative covid test yesterday  Husband was positive  Has not been vaccinated against covid  Treatment: increased Advil during the day Sudafed w/ improvement in her symptoms  Found old doxycycline   Went to beach with brother-in-law - came home Saturday and he called Monday with symptoms and was positive - he had symptoms during the trip  No loss of taste or  smell   Review of Systems  Constitutional: Positive for chills (resolved) and fever (resolved now).  HENT: Positive for ear pain, sinus pressure and sore throat.   Respiratory: Positive for cough. Negative for shortness of breath and wheezing.   Musculoskeletal: Negative for myalgias.  Neurological: Positive for headaches.      Observations/Objective: There were no vitals taken for this visit.  Phone visit:  Patient speaking in complete sentences No distress Alert and oriented Normal mood  Assessment and Plan: Problem List Items Addressed This Visit   None   Visit Diagnoses    Suspected COVID-19 virus infection    -  Primary     Unvaccinated w/ fevers and sinus symptoms and husband and brother-in-law positive for covid  Highly suspect covid and as on day 4 of symptoms discussed initiating Paxlovid while waiting for positive covid test. If send off test is negative then will recommend stopping and monitoring and antibiotics for possible sinus infection if not improved  Follow Up Instructions:  Return if symptoms worsen or fail to improve.   I discussed the assessment and treatment plan with the patient. The patient was provided an opportunity to ask questions and all were answered. The patient agreed with the plan and demonstrated an understanding of the instructions.   The patient was advised to call back or seek an in-person evaluation if the symptoms worsen or if the condition fails to improve as anticipated.  I provided 12 minutes of non-face-to-face time during this encounter. [id number]   Lynnda Child, MD

## 2021-01-16 DIAGNOSIS — H43813 Vitreous degeneration, bilateral: Secondary | ICD-10-CM | POA: Diagnosis not present

## 2021-01-18 ENCOUNTER — Other Ambulatory Visit: Payer: Self-pay

## 2021-01-18 MED ORDER — CYCLOBENZAPRINE HCL 5 MG PO TABS: 5.0000 mg | ORAL_TABLET | Freq: Every evening | ORAL | 0 refills | Status: DC | PRN

## 2021-01-18 NOTE — Telephone Encounter (Signed)
Patient saw Nicki Reaper on 11/10/20 and was given Flexeril for right side sciatica pain. Patient is having a flare up again and does not have any refills on that medication and wanted to know if someone could refill it for her since we do not have any openings this week at this time.  Or does patient need to be seen?  Patient is staying with [facility] and not following Nicki Reaper to the new office

## 2021-01-21 ENCOUNTER — Other Ambulatory Visit: Payer: Self-pay | Admitting: Internal Medicine

## 2021-01-21 NOTE — Telephone Encounter (Signed)
   Notes to clinic Not a pt associated with this practice.

## 2021-02-06 ENCOUNTER — Other Ambulatory Visit: Payer: Self-pay | Admitting: Family Medicine

## 2021-02-08 NOTE — Telephone Encounter (Signed)
Pt had an acute appt on 12/01/20 virtually, last filled on 01/18/21 #20 tabs with 0 refills

## 2021-02-10 ENCOUNTER — Other Ambulatory Visit: Payer: Self-pay | Admitting: Family Medicine

## 2021-02-10 NOTE — Telephone Encounter (Signed)
Ms. Holck notified that refill was approved today and sent to [facility] [address]

## 2021-02-10 NOTE — Telephone Encounter (Signed)
Kristina Bradley left voicemail on triage line stating she needs a refill on her cyclobenzaprine.  She is scheduled to see Dr. Selena Batten on 02/14/21 for back pain but needs something to get her to that appointment.  She is planning on establishing care with the new NP here at [facility].  [facility] [address]

## 2021-02-14 ENCOUNTER — Encounter: Payer: Self-pay | Admitting: Family Medicine

## 2021-02-14 ENCOUNTER — Ambulatory Visit (INDEPENDENT_AMBULATORY_CARE_PROVIDER_SITE_OTHER): Payer: Medicare Other | Admitting: Family Medicine

## 2021-02-14 ENCOUNTER — Other Ambulatory Visit: Payer: Self-pay

## 2021-02-14 VITALS — BP 164/96 | HR 119 | Temp 98.1°F | Ht 61.5 in | Wt 157.0 lb

## 2021-02-14 DIAGNOSIS — I10 Essential (primary) hypertension: Secondary | ICD-10-CM | POA: Diagnosis not present

## 2021-02-14 DIAGNOSIS — G8929 Other chronic pain: Secondary | ICD-10-CM | POA: Diagnosis not present

## 2021-02-14 DIAGNOSIS — M5441 Lumbago with sciatica, right side: Secondary | ICD-10-CM

## 2021-02-14 NOTE — Patient Instructions (Signed)
Back pain - X-ray  - go to [facility] for walk-in X-ray  #Referral I have placed a referral to a specialist for you. You should receive a phone call from the specialty office. Make sure your voicemail is not full and that if you are able to answer your phone to unknown or new numbers.   It may take up to 2 weeks to hear about the referral. If you do not hear anything in 2 weeks, please call our office and ask to speak with the referral coordinator.    If not improving in 6-8 weeks follow-up with Dr. Patsy Lager

## 2021-02-14 NOTE — Progress Notes (Addendum)
Subjective:     Kristina Bradley is a 70 y.o. female presenting for Back Pain     Back Pain This is a chronic problem. The current episode started more than 1 month ago. The problem occurs intermittently. The problem has been gradually worsening since onset. The pain is present in the lumbar spine. The quality of the pain is described as cramping (numbness). The pain radiates to the right foot, right knee and right thigh. The pain is at a severity of 2/10. The pain is Worse during the night. The symptoms are aggravated by standing and bending. Associated symptoms include numbness and tingling. Pertinent negatives include no bladder incontinence, bowel incontinence, perianal numbness or weakness. She has tried muscle relaxant and NSAIDs (deep massage) for the symptoms. The treatment provided mild relief.   Saw Nicki Reaper in April and started muscle relaxer Was offered prednsione but had severe side effects   Review of Systems  Gastrointestinal:  Negative for bowel incontinence.  Genitourinary:  Negative for bladder incontinence.  Musculoskeletal:  Positive for back pain.  Neurological:  Positive for tingling and numbness. Negative for weakness.    Social History   Tobacco Use  Smoking Status Never  Smokeless Tobacco Never        Objective:    BP Readings from Last 3 Encounters:  02/14/21 (!) 164/96  11/07/20 (!) 140/96  06/29/20 140/82   Wt Readings from Last 3 Encounters:  02/14/21 157 lb (71.2 kg)  11/07/20 161 lb (73 kg)  06/29/20 168 lb (76.2 kg)    BP (!) 164/96   Pulse (!) 119   Temp 98.1 F (36.7 C) (Temporal)   Ht 5' 1.5" (1.562 m)   Wt 157 lb (71.2 kg)   SpO2 97%   BMI 29.18 kg/m    Physical Exam Constitutional:      General: She is not in acute distress.    Appearance: She is well-developed. She is not diaphoretic.  HENT:     Right Ear: External ear normal.     Left Ear: External ear normal.  Eyes:     Conjunctiva/sclera: Conjunctivae normal.   Cardiovascular:     Rate and Rhythm: Normal rate.  Pulmonary:     Effort: Pulmonary effort is normal.  Musculoskeletal:     Cervical back: Neck supple.     Comments: Back Inspection: no step offs Palpation: TTP along lower lumbar and left side muscles ROM: normal without signs of pain Strength: normal Straight leg raise - neg Reflexes normal  Skin:    General: Skin is warm and dry.     Capillary Refill: Capillary refill takes less than 2 seconds.  Neurological:     Mental Status: She is alert. Mental status is at baseline.  Psychiatric:        Mood and Affect: Mood normal.        Behavior: Behavior normal.          Assessment & Plan:   Problem List Items Addressed This Visit       Cardiovascular and Mediastinum   Essential hypertension    Suspect pain and taking NSAIDs. Continue to monitor. Advised scheduling TOC with new pcp.          Nervous and Auditory   Chronic midline low back pain with right-sided sciatica - Primary    Persistent back pain with right sided symptoms. Straight leg raise negative, however, with complaint of knee/foot pain. Will get XR and start PT. If not improving f/u with  Dr. Patsy Lager to consider MRI       Relevant Orders   DG Lumbar Spine Complete   Ambulatory referral to Physical Therapy     Return in about 6 weeks (around 03/28/2021) for Dr. Patsy Lager.  Lynnda Child, MD  This visit occurred during the SARS-CoV-2 public health emergency.  Safety protocols were in place, including screening questions prior to the visit, additional usage of staff PPE, and extensive cleaning of exam room while observing appropriate contact time as indicated for disinfecting solutions.

## 2021-02-14 NOTE — Assessment & Plan Note (Signed)
Persistent back pain with right sided symptoms. Straight leg raise negative, however, with complaint of knee/foot pain. Will get XR and start PT. If not improving f/u with Dr. Patsy Lager to consider MRI

## 2021-02-14 NOTE — Assessment & Plan Note (Signed)
Suspect pain and taking NSAIDs. Continue to monitor. Advised scheduling TOC with new pcp.

## 2021-02-17 ENCOUNTER — Ambulatory Visit
Admission: RE | Admit: 2021-02-17 | Discharge: 2021-02-17 | Disposition: A | Discharge status: Home / Self Care | Payer: Medicare Other | Source: Ambulatory Visit | Attending: Family Medicine | Admitting: Family Medicine

## 2021-02-17 ENCOUNTER — Ambulatory Visit
Admission: RE | Admit: 2021-02-17 | Discharge: 2021-02-17 | Disposition: A | Discharge status: Home / Self Care | Payer: Medicare Other | Attending: Family Medicine | Admitting: Family Medicine

## 2021-02-17 DIAGNOSIS — G8929 Other chronic pain: Secondary | ICD-10-CM | POA: Insufficient documentation

## 2021-02-17 DIAGNOSIS — M5441 Lumbago with sciatica, right side: Secondary | ICD-10-CM | POA: Insufficient documentation

## 2021-02-17 DIAGNOSIS — M545 Low back pain, unspecified: Secondary | ICD-10-CM | POA: Diagnosis not present

## 2021-02-22 ENCOUNTER — Encounter: Payer: Self-pay | Admitting: Family Medicine

## 2021-03-01 ENCOUNTER — Other Ambulatory Visit: Payer: Self-pay | Admitting: Internal Medicine

## 2021-03-01 MED ORDER — CYCLOBENZAPRINE HCL 5 MG PO TABS: ORAL_TABLET | ORAL | 0 refills | Status: AC

## 2021-03-01 NOTE — Telephone Encounter (Signed)
Patient call in stated [facility] did not get the referral for Physical Therapy .Would  like a call back [phone]

## 2021-03-01 NOTE — Telephone Encounter (Signed)
  Encourage patient to contact the pharmacy for refills or they can request refills through [facility]  LAST APPOINTMENT DATE:  Please schedule appointment if longer than 1 year  NEXT APPOINTMENT DATE:  MEDICATION:cyclobenzaprine (FLEXERIL) 5 MG table  Is the patient out of medication?   PHARMACY:[facility] [address] [place], [place] - [address]   Let patient know to contact pharmacy at the end of the day to make sure medication is ready.  Please notify patient to allow 48-72 hours to process  CLINICAL FILLS OUT ALL BELOW:   LAST REFILL:  QTY:  REFILL DATE:    OTHER COMMENTS:    Okay for refill?  Please advise

## 2021-03-01 NOTE — Telephone Encounter (Signed)
Last refill 02/10/21 #20 Last office visit 02/14/21

## 2021-03-06 DIAGNOSIS — H43813 Vitreous degeneration, bilateral: Secondary | ICD-10-CM | POA: Diagnosis not present

## 2021-03-07 NOTE — Telephone Encounter (Signed)
[place] j called in from [place] [facility] and she stated that they received the fax and scheduled her already.

## 2021-03-10 DIAGNOSIS — M5416 Radiculopathy, lumbar region: Secondary | ICD-10-CM | POA: Diagnosis not present

## 2021-03-10 DIAGNOSIS — M5136 Other intervertebral disc degeneration, lumbar region: Secondary | ICD-10-CM | POA: Diagnosis not present

## 2021-03-28 DIAGNOSIS — M5417 Radiculopathy, lumbosacral region: Secondary | ICD-10-CM | POA: Diagnosis not present

## 2021-03-28 DIAGNOSIS — M5459 Other low back pain: Secondary | ICD-10-CM | POA: Diagnosis not present

## 2021-03-28 DIAGNOSIS — R262 Difficulty in walking, not elsewhere classified: Secondary | ICD-10-CM | POA: Diagnosis not present

## 2021-03-30 DIAGNOSIS — M5459 Other low back pain: Secondary | ICD-10-CM | POA: Diagnosis not present

## 2021-03-30 DIAGNOSIS — R262 Difficulty in walking, not elsewhere classified: Secondary | ICD-10-CM | POA: Diagnosis not present

## 2021-03-30 DIAGNOSIS — M5417 Radiculopathy, lumbosacral region: Secondary | ICD-10-CM | POA: Diagnosis not present

## 2021-04-05 DIAGNOSIS — M5459 Other low back pain: Secondary | ICD-10-CM | POA: Diagnosis not present

## 2021-04-05 DIAGNOSIS — M5417 Radiculopathy, lumbosacral region: Secondary | ICD-10-CM | POA: Diagnosis not present

## 2021-04-05 DIAGNOSIS — R262 Difficulty in walking, not elsewhere classified: Secondary | ICD-10-CM | POA: Diagnosis not present

## 2021-04-07 DIAGNOSIS — M5459 Other low back pain: Secondary | ICD-10-CM | POA: Diagnosis not present

## 2021-04-07 DIAGNOSIS — M5417 Radiculopathy, lumbosacral region: Secondary | ICD-10-CM | POA: Diagnosis not present

## 2021-04-07 DIAGNOSIS — R262 Difficulty in walking, not elsewhere classified: Secondary | ICD-10-CM | POA: Diagnosis not present

## 2021-04-10 DIAGNOSIS — R262 Difficulty in walking, not elsewhere classified: Secondary | ICD-10-CM | POA: Diagnosis not present

## 2021-04-10 DIAGNOSIS — M5417 Radiculopathy, lumbosacral region: Secondary | ICD-10-CM | POA: Diagnosis not present

## 2021-04-10 DIAGNOSIS — M5459 Other low back pain: Secondary | ICD-10-CM | POA: Diagnosis not present

## 2021-04-18 DIAGNOSIS — M5417 Radiculopathy, lumbosacral region: Secondary | ICD-10-CM | POA: Diagnosis not present

## 2021-04-18 DIAGNOSIS — R262 Difficulty in walking, not elsewhere classified: Secondary | ICD-10-CM | POA: Diagnosis not present

## 2021-04-18 DIAGNOSIS — M5459 Other low back pain: Secondary | ICD-10-CM | POA: Diagnosis not present

## 2021-04-20 DIAGNOSIS — M5417 Radiculopathy, lumbosacral region: Secondary | ICD-10-CM | POA: Diagnosis not present

## 2021-04-20 DIAGNOSIS — M5459 Other low back pain: Secondary | ICD-10-CM | POA: Diagnosis not present

## 2021-04-20 DIAGNOSIS — R262 Difficulty in walking, not elsewhere classified: Secondary | ICD-10-CM | POA: Diagnosis not present

## 2021-04-21 ENCOUNTER — Other Ambulatory Visit: Payer: Self-pay | Admitting: Internal Medicine

## 2021-04-22 NOTE — Telephone Encounter (Signed)
   Notes to clinic Not a pt in this practice.  

## 2021-04-25 DIAGNOSIS — M5459 Other low back pain: Secondary | ICD-10-CM | POA: Diagnosis not present

## 2021-04-25 DIAGNOSIS — M5417 Radiculopathy, lumbosacral region: Secondary | ICD-10-CM | POA: Diagnosis not present

## 2021-04-25 DIAGNOSIS — R262 Difficulty in walking, not elsewhere classified: Secondary | ICD-10-CM | POA: Diagnosis not present

## 2021-04-25 NOTE — Telephone Encounter (Signed)
Last visit 02/14/2021 Last filled baity 11/07/2020  #45 1 RF

## 2021-04-27 DIAGNOSIS — M5417 Radiculopathy, lumbosacral region: Secondary | ICD-10-CM | POA: Diagnosis not present

## 2021-04-27 DIAGNOSIS — R262 Difficulty in walking, not elsewhere classified: Secondary | ICD-10-CM | POA: Diagnosis not present

## 2021-04-27 DIAGNOSIS — M5459 Other low back pain: Secondary | ICD-10-CM | POA: Diagnosis not present

## 2021-05-02 DIAGNOSIS — M5417 Radiculopathy, lumbosacral region: Secondary | ICD-10-CM | POA: Diagnosis not present

## 2021-05-02 DIAGNOSIS — M5459 Other low back pain: Secondary | ICD-10-CM | POA: Diagnosis not present

## 2021-05-02 DIAGNOSIS — R262 Difficulty in walking, not elsewhere classified: Secondary | ICD-10-CM | POA: Diagnosis not present

## 2021-05-04 DIAGNOSIS — R262 Difficulty in walking, not elsewhere classified: Secondary | ICD-10-CM | POA: Diagnosis not present

## 2021-05-04 DIAGNOSIS — M5417 Radiculopathy, lumbosacral region: Secondary | ICD-10-CM | POA: Diagnosis not present

## 2021-05-04 DIAGNOSIS — M5459 Other low back pain: Secondary | ICD-10-CM | POA: Diagnosis not present

## 2021-05-08 DIAGNOSIS — M5136 Other intervertebral disc degeneration, lumbar region: Secondary | ICD-10-CM | POA: Diagnosis not present

## 2021-05-08 DIAGNOSIS — M5416 Radiculopathy, lumbar region: Secondary | ICD-10-CM | POA: Diagnosis not present

## 2021-05-09 DIAGNOSIS — M5417 Radiculopathy, lumbosacral region: Secondary | ICD-10-CM | POA: Diagnosis not present

## 2021-05-09 DIAGNOSIS — M5459 Other low back pain: Secondary | ICD-10-CM | POA: Diagnosis not present

## 2021-05-09 DIAGNOSIS — R262 Difficulty in walking, not elsewhere classified: Secondary | ICD-10-CM | POA: Diagnosis not present

## 2021-05-11 DIAGNOSIS — R262 Difficulty in walking, not elsewhere classified: Secondary | ICD-10-CM | POA: Diagnosis not present

## 2021-05-11 DIAGNOSIS — M5459 Other low back pain: Secondary | ICD-10-CM | POA: Diagnosis not present

## 2021-05-11 DIAGNOSIS — M5417 Radiculopathy, lumbosacral region: Secondary | ICD-10-CM | POA: Diagnosis not present

## 2021-05-16 DIAGNOSIS — M5417 Radiculopathy, lumbosacral region: Secondary | ICD-10-CM | POA: Diagnosis not present

## 2021-05-16 DIAGNOSIS — M5459 Other low back pain: Secondary | ICD-10-CM | POA: Diagnosis not present

## 2021-05-16 DIAGNOSIS — R262 Difficulty in walking, not elsewhere classified: Secondary | ICD-10-CM | POA: Diagnosis not present

## 2021-05-18 DIAGNOSIS — M5417 Radiculopathy, lumbosacral region: Secondary | ICD-10-CM | POA: Diagnosis not present

## 2021-05-18 DIAGNOSIS — M5459 Other low back pain: Secondary | ICD-10-CM | POA: Diagnosis not present

## 2021-05-18 DIAGNOSIS — R262 Difficulty in walking, not elsewhere classified: Secondary | ICD-10-CM | POA: Diagnosis not present

## 2021-05-23 DIAGNOSIS — M5459 Other low back pain: Secondary | ICD-10-CM | POA: Diagnosis not present

## 2021-05-23 DIAGNOSIS — R262 Difficulty in walking, not elsewhere classified: Secondary | ICD-10-CM | POA: Diagnosis not present

## 2021-05-23 DIAGNOSIS — M5417 Radiculopathy, lumbosacral region: Secondary | ICD-10-CM | POA: Diagnosis not present

## 2021-05-25 DIAGNOSIS — M5459 Other low back pain: Secondary | ICD-10-CM | POA: Diagnosis not present

## 2021-05-25 DIAGNOSIS — R262 Difficulty in walking, not elsewhere classified: Secondary | ICD-10-CM | POA: Diagnosis not present

## 2021-05-25 DIAGNOSIS — M5417 Radiculopathy, lumbosacral region: Secondary | ICD-10-CM | POA: Diagnosis not present

## 2021-05-30 DIAGNOSIS — R262 Difficulty in walking, not elsewhere classified: Secondary | ICD-10-CM | POA: Diagnosis not present

## 2021-05-30 DIAGNOSIS — M5417 Radiculopathy, lumbosacral region: Secondary | ICD-10-CM | POA: Diagnosis not present

## 2021-05-30 DIAGNOSIS — M5459 Other low back pain: Secondary | ICD-10-CM | POA: Diagnosis not present

## 2021-06-01 DIAGNOSIS — M5459 Other low back pain: Secondary | ICD-10-CM | POA: Diagnosis not present

## 2021-06-01 DIAGNOSIS — M5417 Radiculopathy, lumbosacral region: Secondary | ICD-10-CM | POA: Diagnosis not present

## 2021-06-01 DIAGNOSIS — R262 Difficulty in walking, not elsewhere classified: Secondary | ICD-10-CM | POA: Diagnosis not present

## 2021-06-06 DIAGNOSIS — M5417 Radiculopathy, lumbosacral region: Secondary | ICD-10-CM | POA: Diagnosis not present

## 2021-06-06 DIAGNOSIS — R262 Difficulty in walking, not elsewhere classified: Secondary | ICD-10-CM | POA: Diagnosis not present

## 2021-06-06 DIAGNOSIS — M5459 Other low back pain: Secondary | ICD-10-CM | POA: Diagnosis not present

## 2021-06-08 DIAGNOSIS — M5417 Radiculopathy, lumbosacral region: Secondary | ICD-10-CM | POA: Diagnosis not present

## 2021-06-08 DIAGNOSIS — M5459 Other low back pain: Secondary | ICD-10-CM | POA: Diagnosis not present

## 2021-06-08 DIAGNOSIS — R262 Difficulty in walking, not elsewhere classified: Secondary | ICD-10-CM | POA: Diagnosis not present

## 2021-06-13 DIAGNOSIS — M5459 Other low back pain: Secondary | ICD-10-CM | POA: Diagnosis not present

## 2021-06-13 DIAGNOSIS — M5417 Radiculopathy, lumbosacral region: Secondary | ICD-10-CM | POA: Diagnosis not present

## 2021-06-13 DIAGNOSIS — R262 Difficulty in walking, not elsewhere classified: Secondary | ICD-10-CM | POA: Diagnosis not present

## 2021-06-15 DIAGNOSIS — R262 Difficulty in walking, not elsewhere classified: Secondary | ICD-10-CM | POA: Diagnosis not present

## 2021-06-15 DIAGNOSIS — M5459 Other low back pain: Secondary | ICD-10-CM | POA: Diagnosis not present

## 2021-06-15 DIAGNOSIS — M5417 Radiculopathy, lumbosacral region: Secondary | ICD-10-CM | POA: Diagnosis not present

## 2021-06-20 DIAGNOSIS — M5417 Radiculopathy, lumbosacral region: Secondary | ICD-10-CM | POA: Diagnosis not present

## 2021-06-20 DIAGNOSIS — M5459 Other low back pain: Secondary | ICD-10-CM | POA: Diagnosis not present

## 2021-06-20 DIAGNOSIS — R262 Difficulty in walking, not elsewhere classified: Secondary | ICD-10-CM | POA: Diagnosis not present

## 2021-06-27 DIAGNOSIS — M5459 Other low back pain: Secondary | ICD-10-CM | POA: Diagnosis not present

## 2021-06-27 DIAGNOSIS — R262 Difficulty in walking, not elsewhere classified: Secondary | ICD-10-CM | POA: Diagnosis not present

## 2021-06-27 DIAGNOSIS — M5417 Radiculopathy, lumbosacral region: Secondary | ICD-10-CM | POA: Diagnosis not present

## 2021-06-29 DIAGNOSIS — R262 Difficulty in walking, not elsewhere classified: Secondary | ICD-10-CM | POA: Diagnosis not present

## 2021-06-29 DIAGNOSIS — M5459 Other low back pain: Secondary | ICD-10-CM | POA: Diagnosis not present

## 2021-06-29 DIAGNOSIS — M5417 Radiculopathy, lumbosacral region: Secondary | ICD-10-CM | POA: Diagnosis not present

## 2021-07-04 DIAGNOSIS — R262 Difficulty in walking, not elsewhere classified: Secondary | ICD-10-CM | POA: Diagnosis not present

## 2021-07-04 DIAGNOSIS — M5417 Radiculopathy, lumbosacral region: Secondary | ICD-10-CM | POA: Diagnosis not present

## 2021-07-04 DIAGNOSIS — M5459 Other low back pain: Secondary | ICD-10-CM | POA: Diagnosis not present

## 2021-07-06 DIAGNOSIS — R262 Difficulty in walking, not elsewhere classified: Secondary | ICD-10-CM | POA: Diagnosis not present

## 2021-07-06 DIAGNOSIS — M5417 Radiculopathy, lumbosacral region: Secondary | ICD-10-CM | POA: Diagnosis not present

## 2021-07-06 DIAGNOSIS — M5459 Other low back pain: Secondary | ICD-10-CM | POA: Diagnosis not present

## 2021-07-11 DIAGNOSIS — M5417 Radiculopathy, lumbosacral region: Secondary | ICD-10-CM | POA: Diagnosis not present

## 2021-07-11 DIAGNOSIS — R262 Difficulty in walking, not elsewhere classified: Secondary | ICD-10-CM | POA: Diagnosis not present

## 2021-07-11 DIAGNOSIS — M5459 Other low back pain: Secondary | ICD-10-CM | POA: Diagnosis not present

## 2021-07-13 DIAGNOSIS — R262 Difficulty in walking, not elsewhere classified: Secondary | ICD-10-CM | POA: Diagnosis not present

## 2021-07-13 DIAGNOSIS — H40023 Open angle with borderline findings, high risk, bilateral: Secondary | ICD-10-CM | POA: Diagnosis not present

## 2021-07-13 DIAGNOSIS — H40053 Ocular hypertension, bilateral: Secondary | ICD-10-CM | POA: Diagnosis not present

## 2021-07-13 DIAGNOSIS — H2513 Age-related nuclear cataract, bilateral: Secondary | ICD-10-CM | POA: Diagnosis not present

## 2021-07-13 DIAGNOSIS — M5417 Radiculopathy, lumbosacral region: Secondary | ICD-10-CM | POA: Diagnosis not present

## 2021-07-13 DIAGNOSIS — M5459 Other low back pain: Secondary | ICD-10-CM | POA: Diagnosis not present

## 2021-07-13 DIAGNOSIS — H52223 Regular astigmatism, bilateral: Secondary | ICD-10-CM | POA: Diagnosis not present

## 2021-07-13 DIAGNOSIS — H524 Presbyopia: Secondary | ICD-10-CM | POA: Diagnosis not present

## 2021-07-13 DIAGNOSIS — H16223 Keratoconjunctivitis sicca, not specified as Sjogren's, bilateral: Secondary | ICD-10-CM | POA: Diagnosis not present

## 2021-07-13 DIAGNOSIS — H04123 Dry eye syndrome of bilateral lacrimal glands: Secondary | ICD-10-CM | POA: Diagnosis not present

## 2021-07-18 DIAGNOSIS — M5417 Radiculopathy, lumbosacral region: Secondary | ICD-10-CM | POA: Diagnosis not present

## 2021-07-18 DIAGNOSIS — M5459 Other low back pain: Secondary | ICD-10-CM | POA: Diagnosis not present

## 2021-07-18 DIAGNOSIS — R262 Difficulty in walking, not elsewhere classified: Secondary | ICD-10-CM | POA: Diagnosis not present

## 2021-07-20 ENCOUNTER — Other Ambulatory Visit: Payer: Self-pay | Admitting: Internal Medicine

## 2021-07-20 DIAGNOSIS — M5417 Radiculopathy, lumbosacral region: Secondary | ICD-10-CM | POA: Diagnosis not present

## 2021-07-20 DIAGNOSIS — R262 Difficulty in walking, not elsewhere classified: Secondary | ICD-10-CM | POA: Diagnosis not present

## 2021-07-20 DIAGNOSIS — M5459 Other low back pain: Secondary | ICD-10-CM | POA: Diagnosis not present

## 2021-07-20 DIAGNOSIS — N951 Menopausal and female climacteric states: Secondary | ICD-10-CM

## 2021-07-20 NOTE — Telephone Encounter (Signed)
Name of Medication: Premarin 0.3 mg Name of Pharmacy: [facility] [address] Last Fill or Written Date and Quantity: # 90 on 11/07/20 Last Office Visit and Type: 02/14/21 back pain Next Office Visit and Type: none scheduled  Sending request to [facility]

## 2021-10-26 ENCOUNTER — Other Ambulatory Visit: Payer: Self-pay | Admitting: Family

## 2021-10-26 DIAGNOSIS — N951 Menopausal and female climacteric states: Secondary | ICD-10-CM

## 2022-08-11 IMAGING — MG MM DIGITAL SCREENING BILAT W/ TOMO AND CAD
8 series · 8 of 24 positions shown · non-contrast
Comparison: Previous exam(s).

CLINICAL DATA: Screening.

EXAM:
DIGITAL SCREENING BILATERAL MAMMOGRAM WITH TOMOSYNTHESIS AND CAD
TECHNIQUE: Bilateral screening digital craniocaudal and mediolateral oblique
mammograms were obtained. Bilateral screening digital breast
tomosynthesis was performed. The images were evaluated with
computer-aided detection.

[L CC synth-2D]
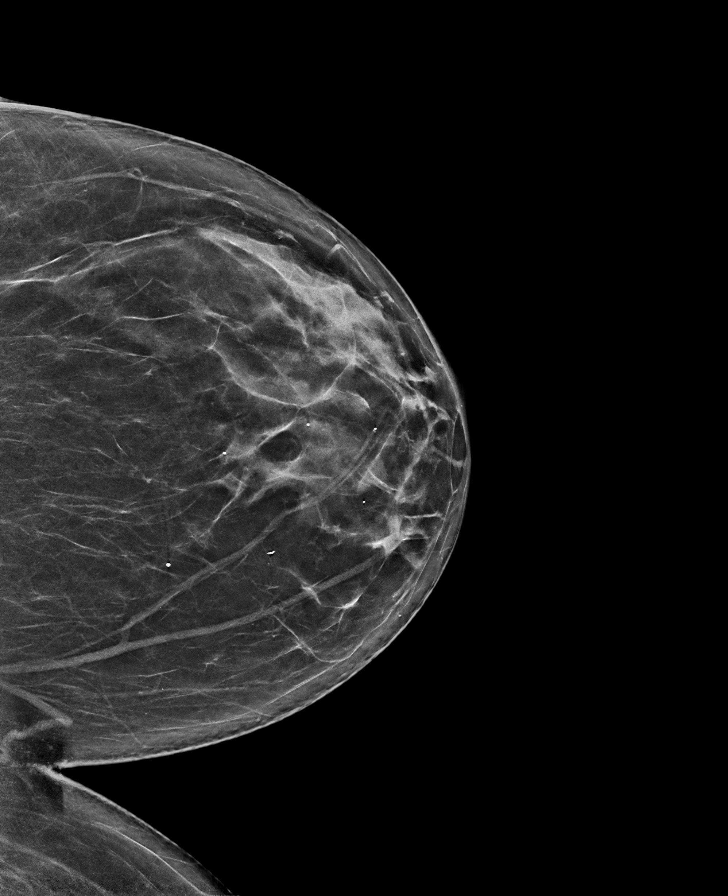

[R CC synth-2D]
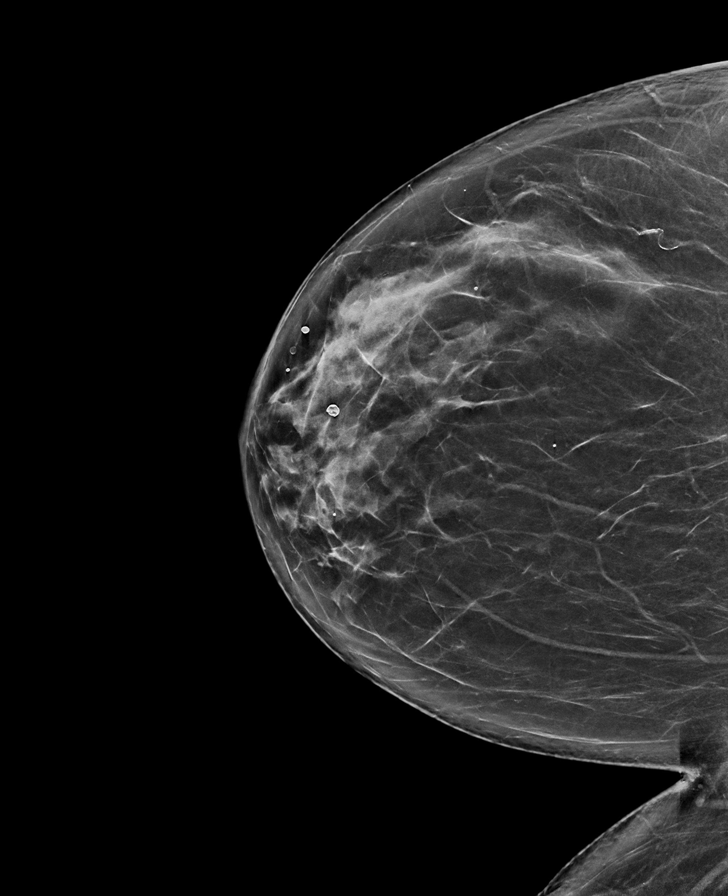

[R MLO synth-2D]
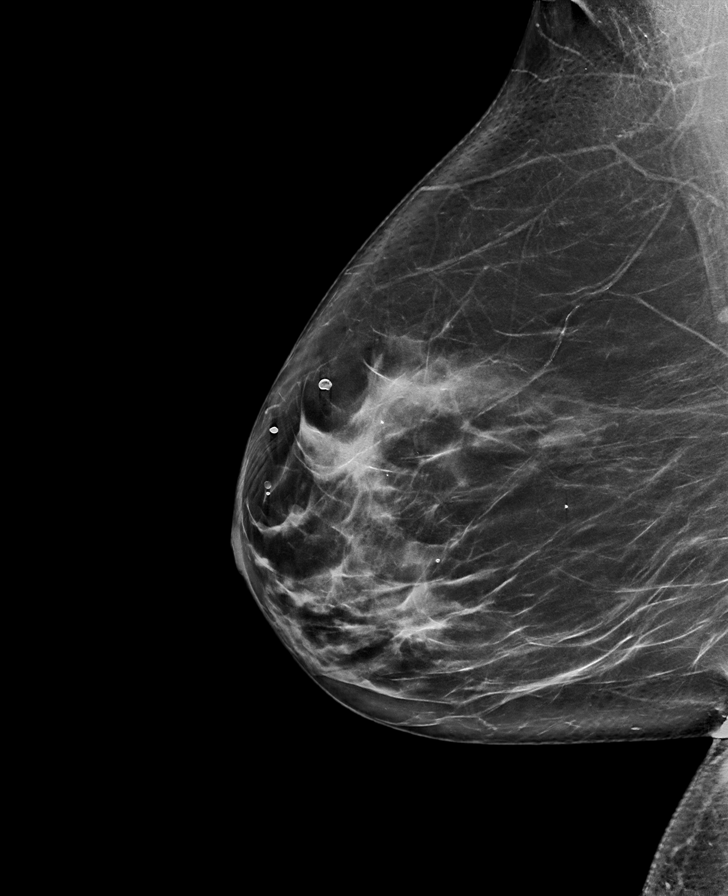

[L MLO synth-2D]
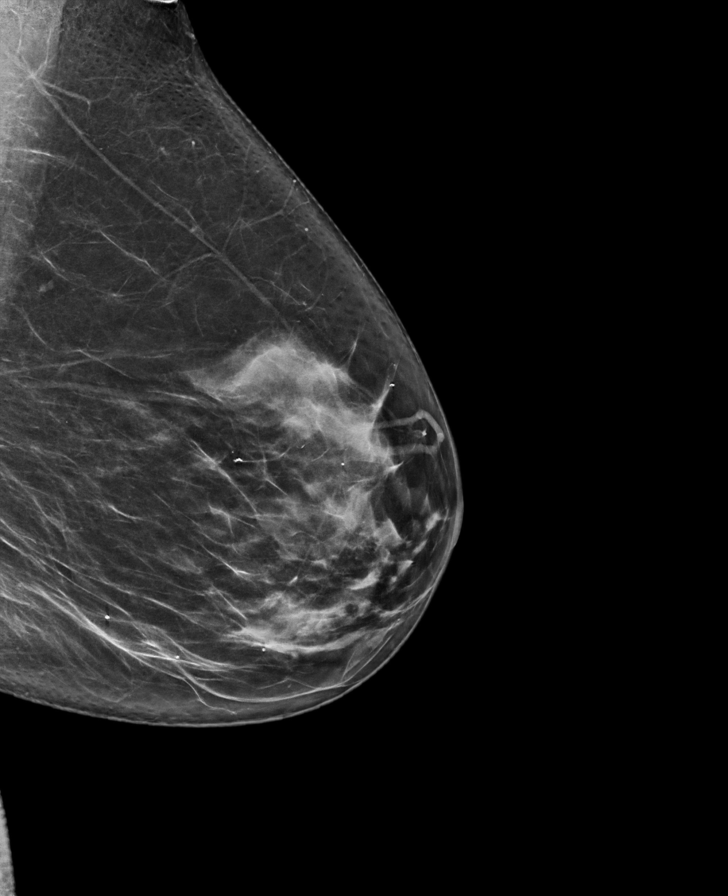

[R CC tomo · tomo slice 39/76.0]
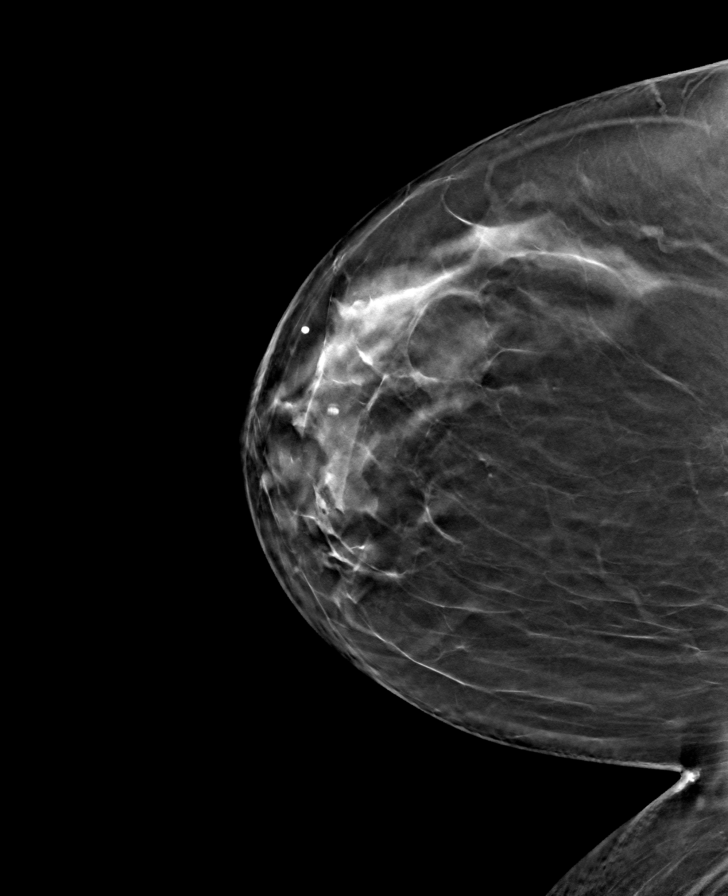

[L CC tomo · tomo slice 37/73.0]
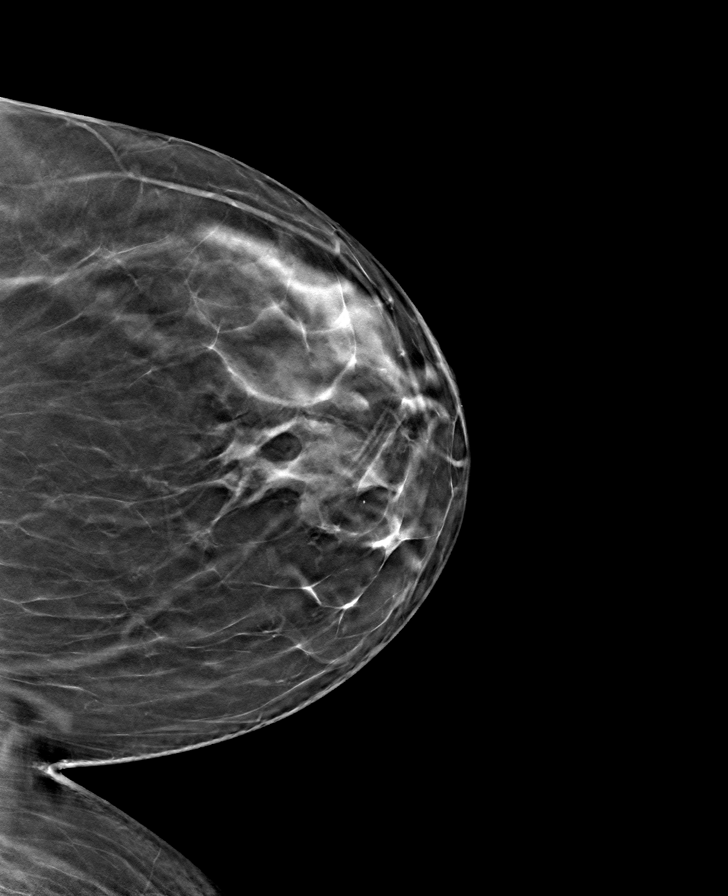

[L MLO tomo · tomo slice 39/76.0]
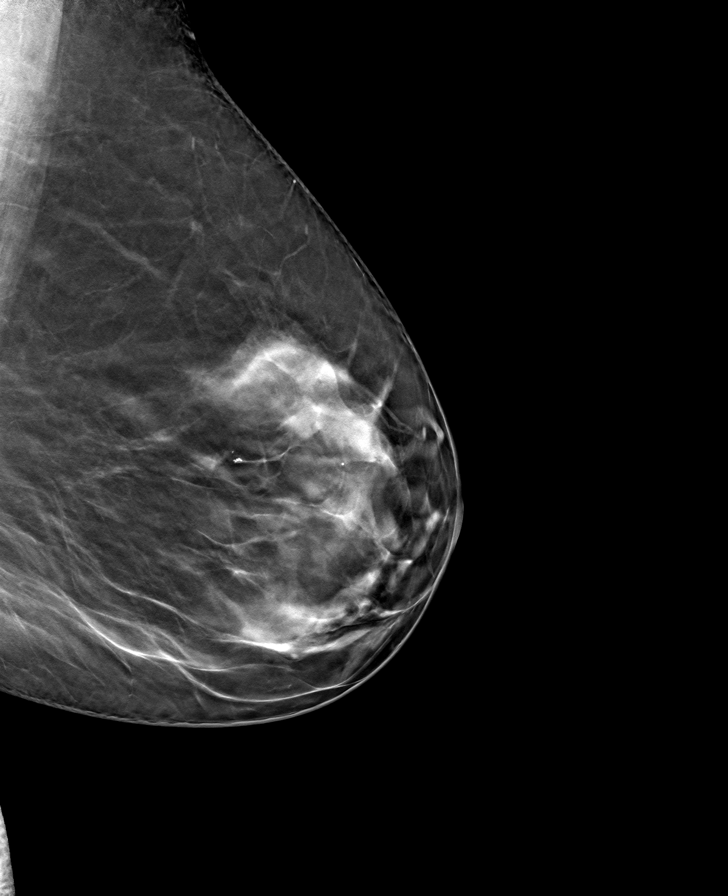

[R MLO tomo · tomo slice 43/84.0]
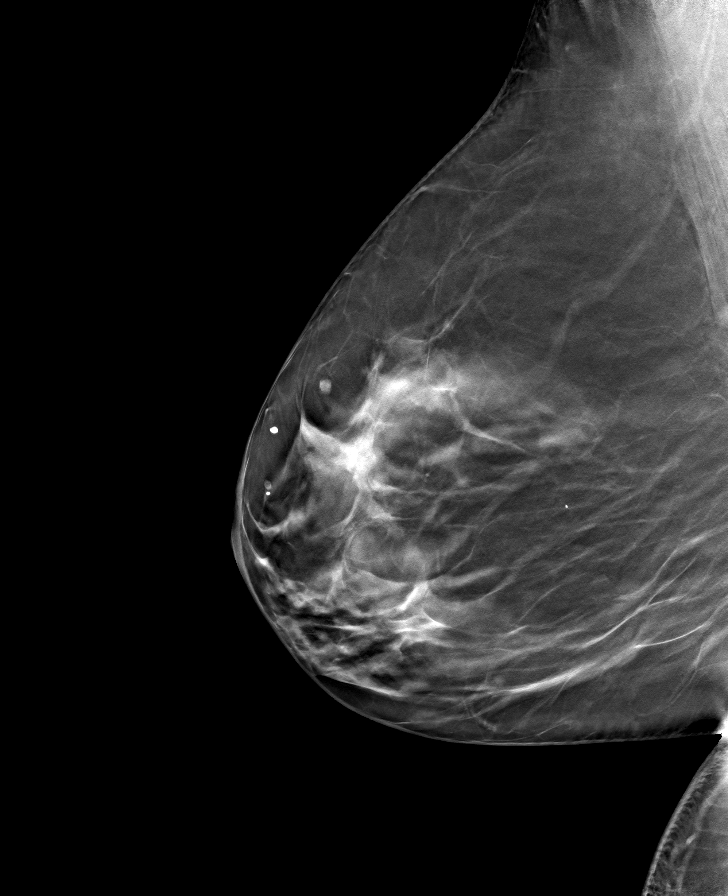

[8 of 24 positions shown; findings below may reference images not displayed]

ACR Breast Density Category c: The breast tissue is heterogeneously
dense, which may obscure small masses.
FINDINGS: There are no findings suspicious for malignancy. The images were
evaluated with computer-aided detection.
IMPRESSION: No mammographic evidence of malignancy. A result letter of this
screening mammogram will be mailed directly to the patient.

RECOMMENDATION:
Screening mammogram in one year. (Code:T4-5-GWO)

BI-RADS CATEGORY  1: Negative.

## 2022-12-04 IMAGING — CR DG LUMBAR SPINE COMPLETE 4+V
1 series · 5 of 5 positions shown · non-contrast
Comparison: None.

CLINICAL DATA: Low back pain that radiates down the right leg to
the top of the foot.

EXAM:
LUMBAR SPINE - COMPLETE 4+ VIEW

[Series 1: dg lumbar spine complete 4 +v · 0.14mm/px · 5 of 5 slices shown]
[im 1/5]
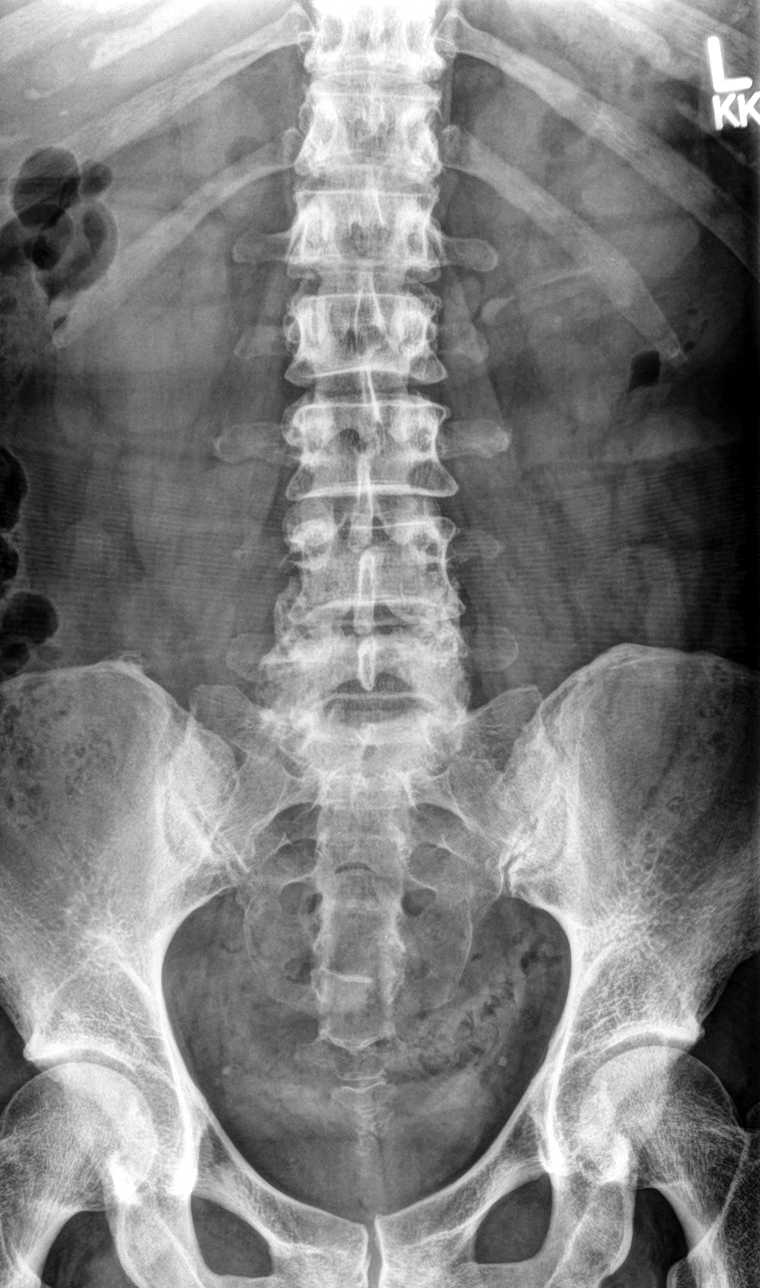
[im 2/5]
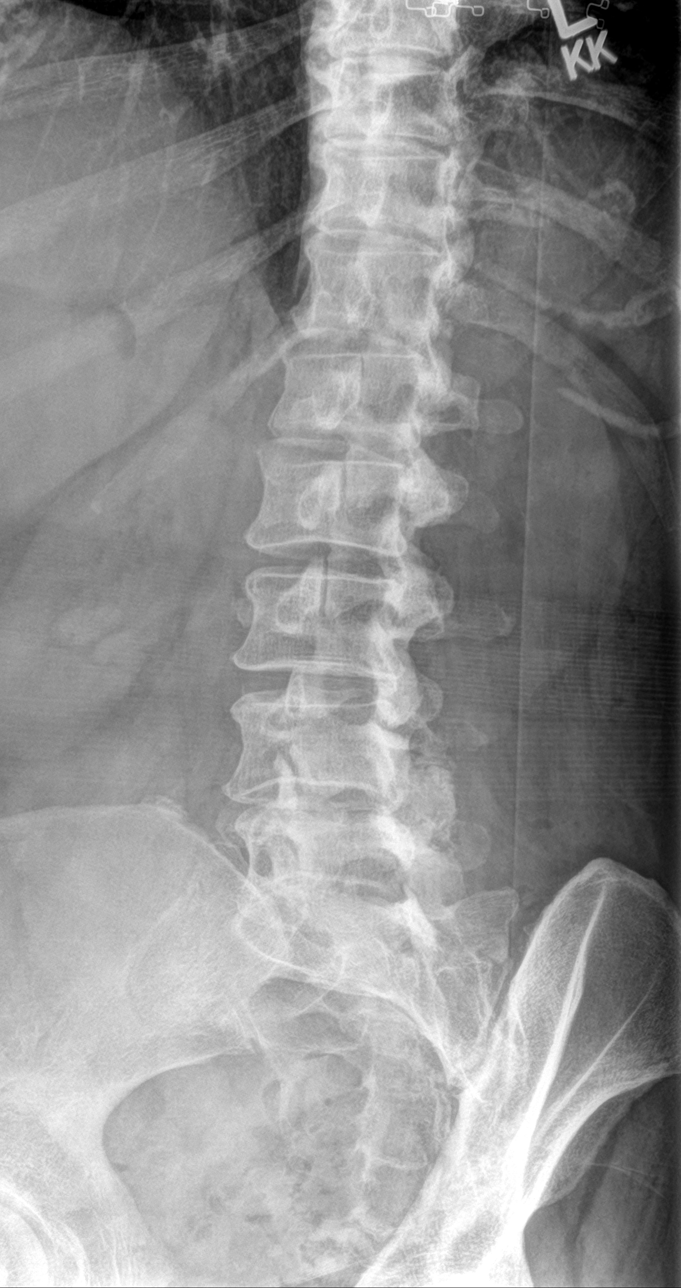
[im 3/5]
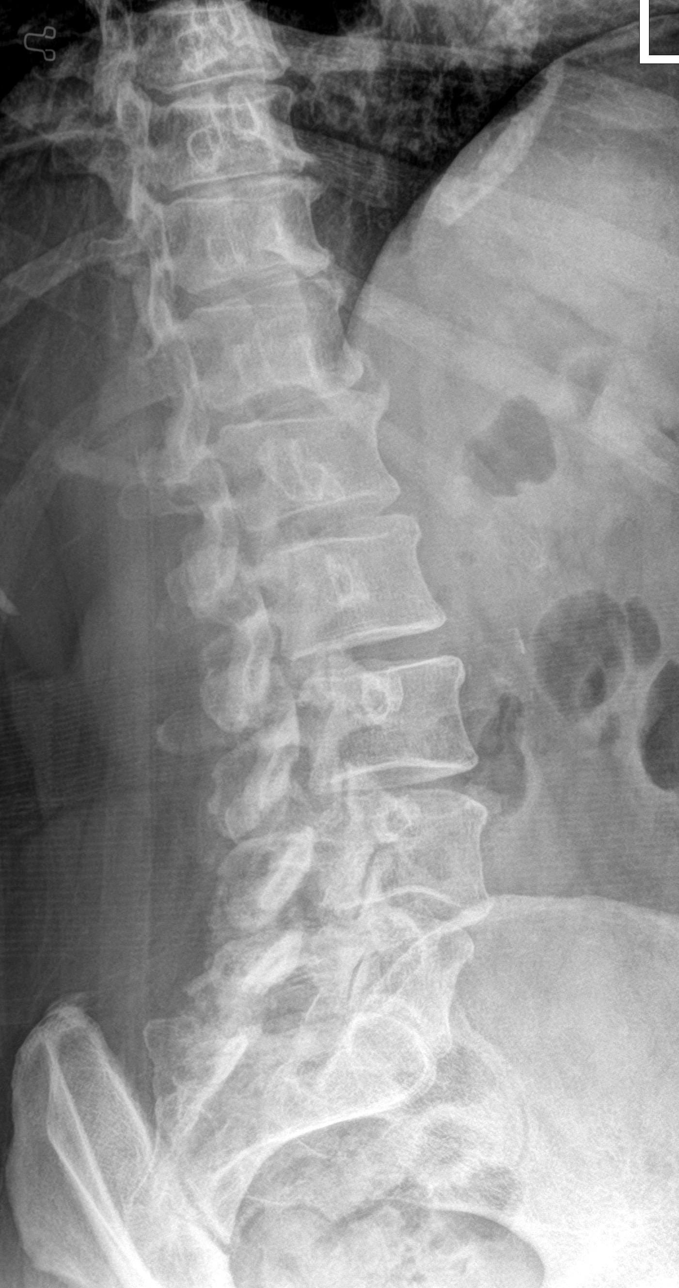
[im 4/5]
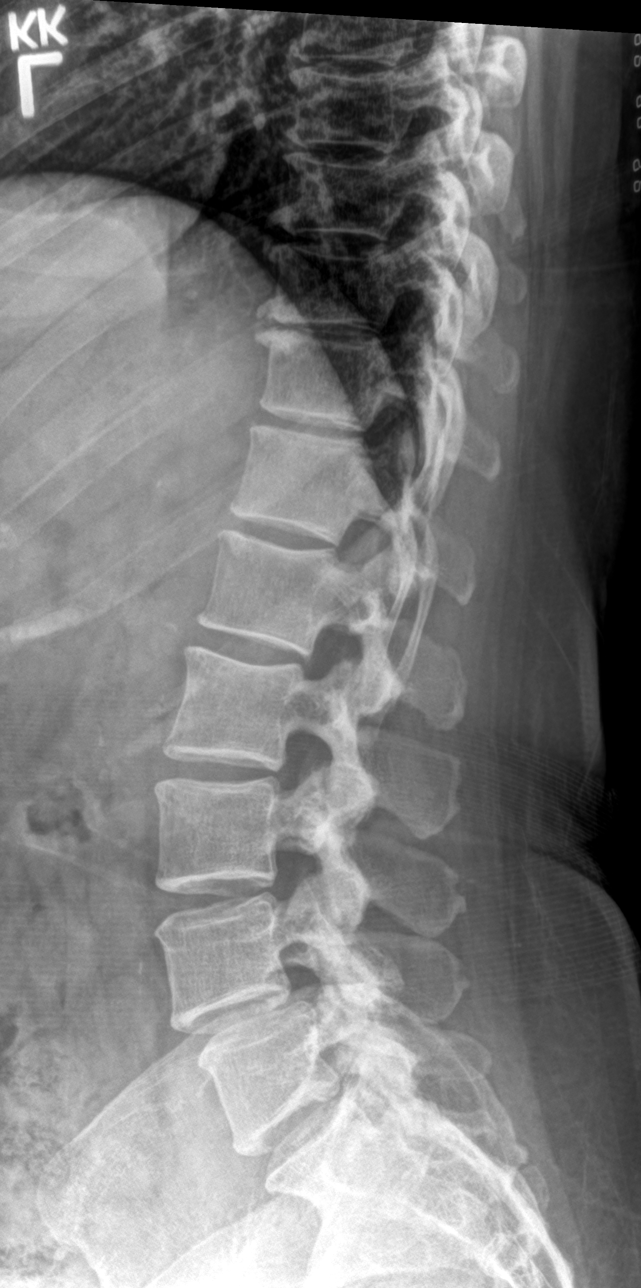
[im 5/5]
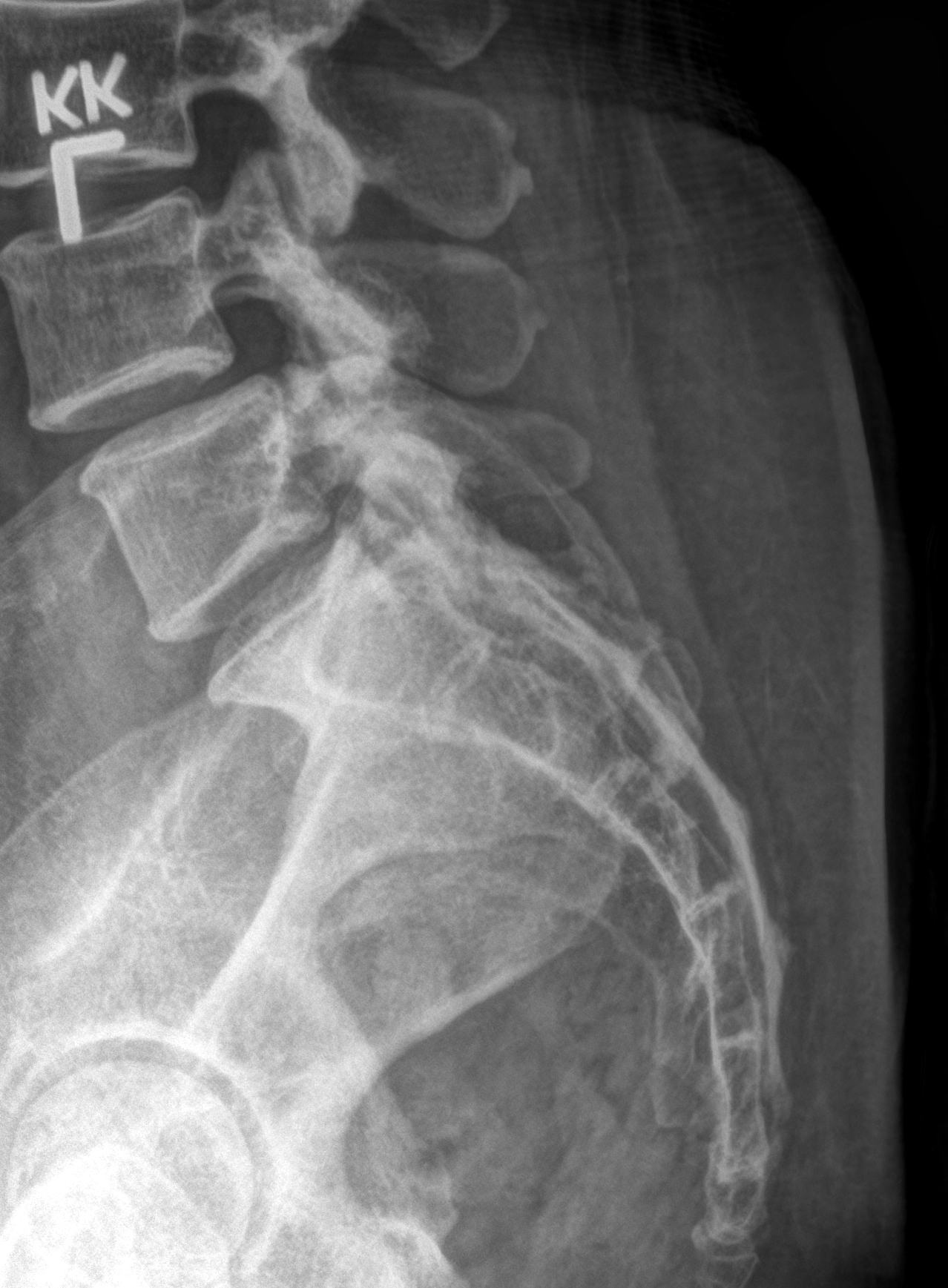

[5 of 5 positions shown; findings below may reference images not displayed]

FINDINGS: There is no evidence of lumbar spine fracture. There is 3 mm
anterolisthesis of L4 on L5. There is a mild to moderate
degenerative disc and joint disease at L4-5 and L5-S1 with mild
neural foraminal stenosis at L5-S1.
IMPRESSION: Degenerative changes in the lower lumbar spine.
